# Patient Record
Sex: Female | Born: 1975 | Race: White | Hispanic: No | Smoking: Former smoker
Health system: Southern US, Community
[De-identification: ages and names within clinical notes are randomized; demographics above are authoritative.]

## PROBLEM LIST (undated history)

## (undated) DIAGNOSIS — N809 Endometriosis, unspecified: Secondary | ICD-10-CM

## (undated) DIAGNOSIS — F988 Other specified behavioral and emotional disorders with onset usually occurring in childhood and adolescence: Secondary | ICD-10-CM

## (undated) DIAGNOSIS — F419 Anxiety disorder, unspecified: Secondary | ICD-10-CM

## (undated) DIAGNOSIS — F319 Bipolar disorder, unspecified: Secondary | ICD-10-CM

## (undated) HISTORY — PX: LAPAROSCOPY: SHX197

## (undated) HISTORY — DX: Anxiety disorder, unspecified: F41.9

## (undated) HISTORY — DX: Bipolar disorder, unspecified: F31.9

## (undated) HISTORY — PX: EYE SURGERY: SHX253

## (undated) HISTORY — PX: ABDOMINAL HYSTERECTOMY: SHX81

## (undated) HISTORY — PX: TONSILLECTOMY: SUR1361

## (undated) HISTORY — DX: Endometriosis, unspecified: N80.9

## (undated) HISTORY — PX: WISDOM TOOTH EXTRACTION: SHX21

## (undated) HISTORY — PX: APPENDECTOMY: SHX54

## (undated) HISTORY — DX: Other specified behavioral and emotional disorders with onset usually occurring in childhood and adolescence: F98.8

---

## 2018-01-19 LAB — HM PAP SMEAR: HM Pap smear: NORMAL

## 2019-01-22 ENCOUNTER — Encounter: Payer: Self-pay | Admitting: Nurse Practitioner

## 2019-01-22 ENCOUNTER — Other Ambulatory Visit: Payer: Self-pay

## 2019-01-22 ENCOUNTER — Ambulatory Visit (INDEPENDENT_AMBULATORY_CARE_PROVIDER_SITE_OTHER): Payer: PRIVATE HEALTH INSURANCE | Admitting: Nurse Practitioner

## 2019-01-22 VITALS — Ht 63.0 in | Wt 137.0 lb

## 2019-01-22 DIAGNOSIS — L299 Pruritus, unspecified: Secondary | ICD-10-CM

## 2019-01-22 DIAGNOSIS — F988 Other specified behavioral and emotional disorders with onset usually occurring in childhood and adolescence: Secondary | ICD-10-CM | POA: Insufficient documentation

## 2019-01-22 DIAGNOSIS — F909 Attention-deficit hyperactivity disorder, unspecified type: Secondary | ICD-10-CM | POA: Insufficient documentation

## 2019-01-22 DIAGNOSIS — R5383 Other fatigue: Secondary | ICD-10-CM

## 2019-01-22 DIAGNOSIS — H538 Other visual disturbances: Secondary | ICD-10-CM | POA: Diagnosis not present

## 2019-01-22 DIAGNOSIS — L209 Atopic dermatitis, unspecified: Secondary | ICD-10-CM | POA: Insufficient documentation

## 2019-01-22 NOTE — Assessment & Plan Note (Signed)
Presenting with polydipsia and vision changes.  Will have her come in office next week for face to face evaluation and labs, including A1C testing.  Referral for eye exam placed.

## 2019-01-22 NOTE — Assessment & Plan Note (Signed)
Referral for eye exam 

## 2019-01-22 NOTE — Assessment & Plan Note (Signed)
To bilateral feet.  Will have her come in office next week for face to face evaluation and labs, including A1C.  Determine treatment plan upon evaluation and labs.

## 2019-01-22 NOTE — Patient Instructions (Signed)
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
## 2019-01-22 NOTE — Assessment & Plan Note (Signed)
No current medications, previously had been on Adderall.  Continue to monitor.  If symptoms increase will send to psychiatry for further testing and recommendations.  She is aware that if Adderall recommended will need controlled substance contract and annual drug screen, which she agrees to.

## 2019-01-25 ENCOUNTER — Telehealth: Payer: Self-pay | Admitting: Nurse Practitioner

## 2019-01-25 ENCOUNTER — Ambulatory Visit: Payer: PRIVATE HEALTH INSURANCE | Admitting: Nurse Practitioner

## 2019-01-25 ENCOUNTER — Encounter: Payer: Self-pay | Admitting: Nurse Practitioner

## 2019-01-25 ENCOUNTER — Other Ambulatory Visit: Payer: Self-pay

## 2019-01-25 VITALS — BP 121/85 | HR 75 | Temp 99.1°F

## 2019-01-25 DIAGNOSIS — L2089 Other atopic dermatitis: Secondary | ICD-10-CM | POA: Diagnosis not present

## 2019-01-25 DIAGNOSIS — Z13220 Encounter for screening for lipoid disorders: Secondary | ICD-10-CM

## 2019-01-25 DIAGNOSIS — H538 Other visual disturbances: Secondary | ICD-10-CM

## 2019-01-25 DIAGNOSIS — R5383 Other fatigue: Secondary | ICD-10-CM | POA: Diagnosis not present

## 2019-01-25 LAB — BAYER DCA HB A1C WAIVED: HB A1C (BAYER DCA - WAIVED): 5.3 % (ref <7.0)

## 2019-01-25 MED ORDER — CLOBETASOL PROPIONATE 0.05 % EX CREA: 1.0000 "application " | TOPICAL_CREAM | Freq: Two times a day (BID) | CUTANEOUS | 1 refills | Status: DC

## 2019-01-25 NOTE — Telephone Encounter (Signed)
Message relayed to patient. Verbalized understanding and denied questions.   

## 2019-01-25 NOTE — Assessment & Plan Note (Signed)
A1C 5.3%.  Labs ordered include thyroid, CBD, CMP, Vit D, lipid panel.  Recommend continued use of Melatonin at night as needed.  Consider Trazodone at night for sleep if continued poor sleep pattern.

## 2019-01-25 NOTE — Telephone Encounter (Signed)
Copied from [place] [id number]. Topic: Referral - Status >> Jan 22, 2019  4:34 PM Yvette Rack wrote: Reason for CRM: Natale Milch with [facility] stated they received a referral request for this pt but it says for snoring and she wanted to know if the request was meant to be sent to ENT. Cb# [phone]

## 2019-01-25 NOTE — Assessment & Plan Note (Signed)
Labs today.  A1C 5.3%, discussed with patient.  Is scheduled for eye exam in a couple weeks.

## 2019-01-25 NOTE — Patient Instructions (Signed)

## 2019-01-25 NOTE — Assessment & Plan Note (Signed)
To bilateral feet.  Trial Clobetasol cream and have return in 4 weeks for assessment.

## 2019-01-25 NOTE — Progress Notes (Signed)
BP 121/85   Pulse 75   Temp 99.1 F (37.3 C) (Oral)   SpO2 99%    Subjective:    Patient ID: Lisa Baird, female    DOB: [birth year], 43 y.o.   MRN: [id number]  HPI: Lisa Baird is a 43 y.o. female  Chief Complaint  Patient presents with  . Follow-up   FATIGUE Has noticed it more over past two weeks. Duration:  months Severity: moderate  Onset: gradual Context when symptoms started:  unknown Symptoms improve with rest: no  Depressive symptoms: no Stress/anxiety: no Insomnia: yes hard to stay asleep, wakes up 2-3 times a night, up at 6 am every day.  Does not take any medication to help her sleep.  Has tried Benadryl and Melatonin in past but both kept her awake.   Snoring: no Observed apnea by bed partner: no Daytime hypersomnolence:no Wakes feeling refreshed: no History of sleep study: no Dysnea on exertion:  no Orthopnea/PND: no Chest pain: no Chronic cough: no Lower extremity edema: no Arthralgias:no Myalgias: no Weakness: no Rash: no   DRY SKIN FEET: Noted to R>L with pruritus.  Only to bottom of feet.  She reports no pain to areas.  States it has been present for a month or two and the pruritus at times wakes her up at night.  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  Gets pedicures.  Has tried all OTC creams including Lamisil, Cortisone, abx ointment, and herbal remedies.  Relevant past medical, surgical, family and social history reviewed and updated as indicated. Interim medical history since our last visit reviewed. Allergies and medications reviewed and updated.  Review of Systems  Constitutional: Positive for fatigue. Negative for activity change, appetite change, diaphoresis and fever.  Respiratory: Negative for cough, chest tightness and shortness of breath.   Cardiovascular: Negative for chest pain, palpitations and leg swelling.  Gastrointestinal: Negative for abdominal distention, abdominal pain, constipation, diarrhea, nausea and  vomiting.  Endocrine: Positive for polyphagia. Negative for cold intolerance, heat intolerance, polydipsia and polyuria.  Neurological: Negative for dizziness, syncope, weakness, light-headedness, numbness and headaches.  Psychiatric/Behavioral: Negative.     Per HPI unless specifically indicated above     Objective:    BP 121/85   Pulse 75   Temp 99.1 F (37.3 C) (Oral)   SpO2 99%   Wt Readings from Last 3 Encounters:  01/22/19 137 lb (62.1 kg)    Physical Exam Vitals signs and nursing note reviewed.  Constitutional:      General: She is awake. She is not in acute distress.    Appearance: She is well-developed. She is not ill-appearing.  HENT:     Head: Normocephalic.     Right Ear: Hearing normal.     Left Ear: Hearing normal.     Nose: Nose normal.     Mouth/Throat:     Mouth: Mucous membranes are moist.  Eyes:     General: Lids are normal.        Right eye: No discharge.        Left eye: No discharge.     Conjunctiva/sclera: Conjunctivae normal.     Pupils: Pupils are equal, round, and reactive to light.  Neck:     Musculoskeletal: Normal range of motion and neck supple.     Thyroid: No thyromegaly.     Vascular: No carotid bruit.  Cardiovascular:     Rate and Rhythm: Normal rate and regular rhythm.     Pulses:  Dorsalis pedis pulses are 2+ on the right side and 2+ on the left side.       Posterior tibial pulses are 2+ on the right side and 2+ on the left side.     Heart sounds: Normal heart sounds. No murmur. No gallop.   Pulmonary:     Effort: Pulmonary effort is normal. No accessory muscle usage or respiratory distress.     Breath sounds: Normal breath sounds.  Abdominal:     General: Bowel sounds are normal.     Palpations: Abdomen is soft. There is no hepatomegaly or splenomegaly.     Tenderness: There is no abdominal tenderness.  Musculoskeletal:     Right lower leg: No edema.     Left lower leg: No edema.  Lymphadenopathy:     Cervical: No  cervical adenopathy.  Skin:    General: Skin is warm and dry.     Comments: Pedal aspect of bilateral feet at arch, R>L, with 5 cm dry patch of skin with white scaling, no erythema or drainage.  No scaling or erythema noted to toes or in between toes.    Neurological:     Mental Status: She is alert and oriented to person, place, and time.     Cranial Nerves: Cranial nerves are intact.     Deep Tendon Reflexes: Reflexes are normal and symmetric.     Reflex Scores:      Brachioradialis reflexes are 2+ on the right side and 2+ on the left side.      Patellar reflexes are 2+ on the right side and 2+ on the left side. Psychiatric:        Attention and Perception: Attention normal.        Mood and Affect: Mood normal.        Speech: Speech normal.        Behavior: Behavior normal. Behavior is cooperative.        Thought Content: Thought content normal.        Judgment: Judgment normal.     No results found for this or any previous visit.    Assessment & Plan:   Problem List Items Addressed This Visit      Musculoskeletal and Integument   Atopic dermatitis    To bilateral feet.  Trial Clobetasol cream and have return in 4 weeks for assessment.        Other   Fatigue - Primary    A1C 5.3%.  Labs ordered include thyroid, CBD, CMP, Vit D, lipid panel.  Recommend continued use of Melatonin at night as needed.  Consider Trazodone at night for sleep if continued poor sleep pattern.      Relevant Orders   Bayer DCA Hb A1c Waived   Comprehensive metabolic panel   CBC with Differential/Platelet   Thyroid Panel With TSH   VITAMIN D 25 Hydroxy (Vit-D Deficiency, Fractures)   Blurred vision, bilateral    Labs today.  A1C 5.3%, discussed with patient.  Is scheduled for eye exam in a couple weeks.      Relevant Orders   Bayer DCA Hb A1c Waived   Comprehensive metabolic panel   CBC with Differential/Platelet   Thyroid Panel With TSH    Other Visit Diagnoses    Screening cholesterol  level       Relevant Orders   Lipid Panel w/o Chol/HDL Ratio       Follow up plan: Return in about 4 weeks (around 02/22/2019) for Fatigue and atopic  dermatitis.

## 2019-01-25 NOTE — Addendum Note (Signed)
Addended by: Marnee Guarneri T on: 01/25/2019 08:45 AM   Modules accepted: Orders

## 2019-01-26 ENCOUNTER — Encounter: Payer: Self-pay | Admitting: Nurse Practitioner

## 2019-01-26 DIAGNOSIS — E559 Vitamin D deficiency, unspecified: Secondary | ICD-10-CM | POA: Insufficient documentation

## 2019-01-26 LAB — CBC WITH DIFFERENTIAL/PLATELET
Basophils Absolute: 0.1 10*3/uL (ref 0.0–0.2)
Basos: 1 %
EOS (ABSOLUTE): 0.1 10*3/uL (ref 0.0–0.4)
Eos: 1 %
Hematocrit: 39.2 % (ref 34.0–46.6)
Hemoglobin: 12.8 g/dL (ref 11.1–15.9)
Immature Grans (Abs): 0 10*3/uL (ref 0.0–0.1)
Immature Granulocytes: 0 %
Lymphocytes Absolute: 3.4 10*3/uL — ABNORMAL HIGH (ref 0.7–3.1)
Lymphs: 36 %
MCH: 28.8 pg (ref 26.6–33.0)
MCHC: 32.7 g/dL (ref 31.5–35.7)
MCV: 88 fL (ref 79–97)
Monocytes Absolute: 0.6 10*3/uL (ref 0.1–0.9)
Monocytes: 6 %
Neutrophils Absolute: 5.5 10*3/uL (ref 1.4–7.0)
Neutrophils: 56 %
Platelets: 178 10*3/uL (ref 150–450)
RBC: 4.44 x10E6/uL (ref 3.77–5.28)
RDW: 12.4 % (ref 11.7–15.4)
WBC: 9.7 10*3/uL (ref 3.4–10.8)

## 2019-01-26 LAB — COMPREHENSIVE METABOLIC PANEL
ALT: 15 IU/L (ref 0–32)
AST: 15 IU/L (ref 0–40)
Albumin/Globulin Ratio: 2.1 (ref 1.2–2.2)
Albumin: 4.7 g/dL (ref 3.8–4.8)
Alkaline Phosphatase: 44 IU/L (ref 39–117)
BUN/Creatinine Ratio: 17 (ref 9–23)
BUN: 14 mg/dL (ref 6–24)
Bilirubin Total: 0.4 mg/dL (ref 0.0–1.2)
CO2: 23 mmol/L (ref 20–29)
Calcium: 9.4 mg/dL (ref 8.7–10.2)
Chloride: 103 mmol/L (ref 96–106)
Creatinine, Ser: 0.83 mg/dL (ref 0.57–1.00)
GFR calc Af Amer: 101 mL/min/{1.73_m2} (ref >59)
GFR calc non Af Amer: 88 mL/min/{1.73_m2} (ref >59)
Globulin, Total: 2.2 g/dL (ref 1.5–4.5)
Glucose: 84 mg/dL (ref 65–99)
Potassium: 4.2 mmol/L (ref 3.5–5.2)
Sodium: 143 mmol/L (ref 134–144)
Total Protein: 6.9 g/dL (ref 6.0–8.5)

## 2019-01-26 LAB — THYROID PANEL WITH TSH
Free Thyroxine Index: 1.8 (ref 1.2–4.9)
T3 Uptake Ratio: 25 % (ref 24–39)
T4, Total: 7 ug/dL (ref 4.5–12.0)
TSH: 2.39 u[IU]/mL (ref 0.450–4.500)

## 2019-01-26 LAB — VITAMIN D 25 HYDROXY (VIT D DEFICIENCY, FRACTURES): Vit D, 25-Hydroxy: 28 ng/mL — ABNORMAL LOW (ref 30.0–100.0)

## 2019-01-26 LAB — LIPID PANEL W/O CHOL/HDL RATIO
Cholesterol, Total: 172 mg/dL (ref 100–199)
HDL: 81 mg/dL (ref >39)
LDL Calculated: 77 mg/dL (ref 0–99)
Triglycerides: 71 mg/dL (ref 0–149)
VLDL Cholesterol Cal: 14 mg/dL (ref 5–40)

## 2019-02-09 ENCOUNTER — Other Ambulatory Visit: Payer: Self-pay | Admitting: Nurse Practitioner

## 2019-02-09 MED ORDER — KETOCONAZOLE 2 % EX CREA: 1.0000 "application " | TOPICAL_CREAM | Freq: Every day | CUTANEOUS | 0 refills | Status: DC

## 2019-02-23 ENCOUNTER — Ambulatory Visit: Payer: PRIVATE HEALTH INSURANCE | Admitting: Nurse Practitioner

## 2019-04-15 DIAGNOSIS — U071 COVID-19: Secondary | ICD-10-CM

## 2019-04-15 HISTORY — DX: COVID-19: U07.1

## 2019-04-19 ENCOUNTER — Other Ambulatory Visit: Payer: Self-pay | Admitting: Nurse Practitioner

## 2019-04-19 MED ORDER — AZITHROMYCIN 250 MG PO TABS: ORAL_TABLET | ORAL | 0 refills | Status: DC

## 2019-04-19 MED ORDER — BENZONATATE 100 MG PO CAPS: 100.0000 mg | ORAL_CAPSULE | Freq: Three times a day (TID) | ORAL | 0 refills | Status: DC | PRN

## 2019-04-19 NOTE — Progress Notes (Signed)
Patient with positive diagnosis of Covid 19 recently, continues to have symptoms with no relief from OTC medications.  Will send in scripts and advise if worsening to go to ER immediately.

## 2020-01-21 ENCOUNTER — Encounter: Payer: Self-pay | Admitting: Nurse Practitioner

## 2020-01-21 ENCOUNTER — Ambulatory Visit: Payer: PRIVATE HEALTH INSURANCE | Admitting: Nurse Practitioner

## 2020-01-21 ENCOUNTER — Other Ambulatory Visit: Payer: Self-pay

## 2020-01-21 VITALS — BP 112/78 | HR 86 | Temp 98.4°F | Wt 141.4 lb

## 2020-01-21 DIAGNOSIS — F909 Attention-deficit hyperactivity disorder, unspecified type: Secondary | ICD-10-CM

## 2020-01-21 NOTE — Progress Notes (Signed)
BP 112/78   Pulse 86   Temp 98.4 F (36.9 C) (Oral)   Wt 141 lb 6.4 oz (64.1 kg)   LMP 01/13/2020 (Approximate)   SpO2 96%   BMI 25.05 kg/m    Subjective:    Patient ID: Lisa Baird, female    DOB: 1976/02/16, 44 y.o.   MRN: [id number]  HPI: Lisa Baird is a 44 y.o. female  Chief Complaint  Patient presents with  . ADHD    pt states she would like to discuss starting something for ADHD, states it is getting worse lately   ADHD  Has history of ADHD  -- feels like symptoms are becoming worse lately.  Took a generic form of Adderall in past.  Has been off medication for almost 2 years.  Was seen by psychiatry in past. ADHD status: uncontrolled Previous medications: Adderall Work/school performance:  fair Difficulty sustaining attention/completing tasks: yes Distracted by extraneous stimuli: yes Does not listen when spoken to: yes  Fidgets with hands or feet: yes Unable to stay in seat: yes Blurts out/interrupts others: no    Anxiousness: when overwhelmed    Dizziness: no    Tics: no  Relevant past medical, surgical, family and social history reviewed and updated as indicated. Interim medical history since our last visit reviewed. Allergies and medications reviewed and updated.  Review of Systems  Constitutional: Negative for activity change, appetite change, diaphoresis, fatigue and fever.  Respiratory: Negative for cough, chest tightness and shortness of breath.   Cardiovascular: Negative for chest pain, palpitations and leg swelling.  Gastrointestinal: Negative.   Neurological: Negative.   Psychiatric/Behavioral: Positive for decreased concentration. Negative for self-injury, sleep disturbance and suicidal ideas. The patient is nervous/anxious.     Per HPI unless specifically indicated above     Objective:    BP 112/78   Pulse 86   Temp 98.4 F (36.9 C) (Oral)   Wt 141 lb 6.4 oz (64.1 kg)   LMP 01/13/2020 (Approximate)   SpO2 96%   BMI 25.05 kg/m   Wt  Readings from Last 3 Encounters:  01/21/20 141 lb 6.4 oz (64.1 kg)  01/22/19 137 lb (62.1 kg)    Physical Exam Vitals and nursing note reviewed.  Constitutional:      General: She is awake. She is not in acute distress.    Appearance: She is well-developed and well-groomed. She is not ill-appearing.  HENT:     Head: Normocephalic.     Right Ear: Hearing normal.     Left Ear: Hearing normal.  Eyes:     General: Lids are normal.        Right eye: No discharge.        Left eye: No discharge.     Conjunctiva/sclera: Conjunctivae normal.     Pupils: Pupils are equal, round, and reactive to light.  Neck:     Thyroid: No thyromegaly.     Vascular: No carotid bruit.  Cardiovascular:     Rate and Rhythm: Normal rate and regular rhythm.     Heart sounds: Normal heart sounds. No murmur heard.  No gallop.   Pulmonary:     Effort: Pulmonary effort is normal. No accessory muscle usage or respiratory distress.     Breath sounds: Normal breath sounds.  Abdominal:     General: Bowel sounds are normal.     Palpations: Abdomen is soft.  Musculoskeletal:     Cervical back: Normal range of motion and neck supple.  Right lower leg: No edema.     Left lower leg: No edema.  Skin:    General: Skin is warm and dry.  Neurological:     Mental Status: She is alert and oriented to person, place, and time.  Psychiatric:        Attention and Perception: Attention normal.        Mood and Affect: Mood normal.        Speech: Speech normal.        Behavior: Behavior normal. Behavior is cooperative.        Thought Content: Thought content normal.     Results for orders placed or performed in visit on 01/21/20  HM PAP SMEAR  Result Value Ref Range   HM Pap smear patient reported normal       Assessment & Plan:   Problem List Items Addressed This Visit      Other   ADHD - Primary    No current medications, previously had been on Adderall.  Feels symptoms are returning.  Will refer to  [facility] for testing and medication initiation.  Discussed with her and she would like testing, once on stable regimen then PCP can takeover regimen in office and prescribing.  She is aware that if medication initiated will need controlled substance contract and annual drug screen, which she agrees to.      Relevant Orders   Ambulatory referral to Psychiatry       Follow up plan: Return if symptoms worsen or fail to improve.

## 2020-01-21 NOTE — Patient Instructions (Signed)

## 2020-01-21 NOTE — Assessment & Plan Note (Signed)
No current medications, previously had been on Adderall.  Feels symptoms are returning.  Will refer to [facility] for testing and medication initiation.  Discussed with her and she would like testing, once on stable regimen then PCP can takeover regimen in office and prescribing.  She is aware that if medication initiated will need controlled substance contract and annual drug screen, which she agrees to.

## 2020-01-26 NOTE — Addendum Note (Signed)
Addended by: Marnee Guarneri T on: 01/26/2020 03:37 PM   Modules accepted: Orders

## 2020-05-11 ENCOUNTER — Ambulatory Visit: Payer: PRIVATE HEALTH INSURANCE | Admitting: Family Medicine

## 2020-05-15 ENCOUNTER — Ambulatory Visit: Payer: PRIVATE HEALTH INSURANCE | Admitting: Family Medicine

## 2020-05-16 ENCOUNTER — Ambulatory Visit: Payer: PRIVATE HEALTH INSURANCE | Admitting: Nurse Practitioner

## 2020-05-16 ENCOUNTER — Other Ambulatory Visit: Payer: Self-pay

## 2020-05-16 ENCOUNTER — Encounter: Payer: Self-pay | Admitting: Nurse Practitioner

## 2020-05-16 VITALS — BP 113/72 | HR 79 | Temp 98.3°F | Ht 63.0 in | Wt 134.2 lb

## 2020-05-16 DIAGNOSIS — K625 Hemorrhage of anus and rectum: Secondary | ICD-10-CM

## 2020-05-16 NOTE — Progress Notes (Signed)
BP 113/72    Pulse 79    Temp 98.3 F (36.8 C) (Oral)    Ht 5\' 3"  (1.6 m)    Wt 134 lb 3.2 oz (60.9 kg)    LMP 04/19/2020 (Approximate)    SpO2 96%    BMI 23.77 kg/m    Subjective:    Patient ID: Lisa Baird, female    DOB: [birth year], 44 y.o.   MRN: [id number]  HPI: Lisa Baird is a 44 y.o. female  Chief Complaint  Patient presents with   Rectal Bleeding    On and off for 2 weeks, Bright red blood , no pain today   RECTAL BLEEDING Started about 2 weeks ago, on and off -- has noticed more than 10 times.  Had one episode of blood drops on floor, bright red blood.  No pain with this.  A few times has happened when passed a bowel movement and then other moments have been when she did pass bowel movement.  Does not perform anal intercourse.  Denies constipation, has regular/soft bowel movements.  Does have one child -- 26 years old -- vaginal birth.  Has had hemorrhoids before, but had discomfort with this.  No family history of colon cancer.  Non smoker.  Casual drinker.  Had episode yesterday, but not today. Duration: weeks Anal fullness: no Perianal itching/irritation: no Perianal pain: no Bright red rectal bleeding: yes Amount of blood: x 2 times without BM it was with small and then with BM it is moderate Frequency: 10 episodes over past two weeks Constipation: no Hard stools: no Chronic straining/valsava: no Alleviating factors: nothing Aggravating factors: nothing Status: fluctuating Treatments attempted: nothing  Previous hemorrhoids: yes  Colonoscopy: no  Relevant past medical, surgical, family and social history reviewed and updated as indicated. Interim medical history since our last visit reviewed. Allergies and medications reviewed and updated.  Review of Systems  Constitutional: Negative for activity change, appetite change, diaphoresis, fatigue and fever.  Respiratory: Negative for cough, chest tightness and shortness of breath.   Cardiovascular: Negative for  chest pain, palpitations and leg swelling.  Gastrointestinal: Positive for anal bleeding. Negative for abdominal distention, abdominal pain, blood in stool, constipation, diarrhea, nausea, rectal pain and vomiting.  Endocrine: Negative for cold intolerance and heat intolerance.  Psychiatric/Behavioral: Negative.     Per HPI unless specifically indicated above     Objective:    BP 113/72    Pulse 79    Temp 98.3 F (36.8 C) (Oral)    Ht 5\' 3"  (1.6 m)    Wt 134 lb 3.2 oz (60.9 kg)    LMP 04/19/2020 (Approximate)    SpO2 96%    BMI 23.77 kg/m   Wt Readings from Last 3 Encounters:  05/16/20 134 lb 3.2 oz (60.9 kg)  01/21/20 141 lb 6.4 oz (64.1 kg)  01/22/19 137 lb (62.1 kg)    Physical Exam Vitals and nursing note reviewed.  Constitutional:      General: She is awake. She is not in acute distress.    Appearance: She is well-developed and well-groomed. She is not ill-appearing.  HENT:     Head: Normocephalic.     Right Ear: Hearing normal.     Left Ear: Hearing normal.  Eyes:     General: Lids are normal.        Right eye: No discharge.        Left eye: No discharge.     Conjunctiva/sclera: Conjunctivae normal.  Pupils: Pupils are equal, round, and reactive to light.  Neck:     Thyroid: No thyromegaly.     Vascular: No carotid bruit.  Cardiovascular:     Rate and Rhythm: Normal rate and regular rhythm.     Heart sounds: Normal heart sounds. No murmur heard.  No gallop.   Pulmonary:     Effort: Pulmonary effort is normal. No accessory muscle usage or respiratory distress.     Breath sounds: Normal breath sounds.  Abdominal:     General: Bowel sounds are normal. There is no distension.     Palpations: Abdomen is soft. There is no hepatomegaly or splenomegaly.     Tenderness: There is no abdominal tenderness. There is no right CVA tenderness or left CVA tenderness.     Hernia: No hernia is present.  Genitourinary:    Rectum: Internal hemorrhoid (small hemorrhoid noted)  present. No mass, tenderness or external hemorrhoid. Normal anal tone.     Comments: External rectal exam no hemorrhoids noted, small internal hemorrhoid palpated.  No blood on glove with exam. Musculoskeletal:     Cervical back: Normal range of motion and neck supple.     Right lower leg: No edema.     Left lower leg: No edema.  Skin:    General: Skin is warm and dry.  Neurological:     Mental Status: She is alert and oriented to person, place, and time.  Psychiatric:        Attention and Perception: Attention normal.        Mood and Affect: Mood normal.        Speech: Speech normal.        Behavior: Behavior normal. Behavior is cooperative.        Thought Content: Thought content normal.     Results for orders placed or performed in visit on 01/21/20  HM PAP SMEAR  Result Value Ref Range   HM Pap smear patient reported normal       Assessment & Plan:   Problem List Items Addressed This Visit      Digestive   Rectal bleeding - Primary    Present x 2 weeks.  Painless and present with and without BM.  ?internal hemorrhoid.  Will obtain labs today to include CBC, iron, FOBT, CMP, TSH.  Recommend she document events and any symptoms + whether BM present or not -- track bleeding episodes.  Will place referral to GI for further work-up.  Discussed at length with her.  To follow-up in 4 weeks, sooner if any worsening symptoms -- discussed with her at length.      Relevant Orders   CBC with Differential/Platelet   TSH   Fecal occult blood, imunochemical   Iron, TIBC and Ferritin Panel   Comprehensive metabolic panel   Ambulatory referral to Gastroenterology       Follow up plan: Return in about 4 weeks (around 06/13/2020) for Rectal bleeding.

## 2020-05-16 NOTE — Patient Instructions (Signed)
Rectal Bleeding  Rectal bleeding is when blood comes out of the opening of the butt (anus). People with this kind of bleeding may notice bright red blood in their underwear or in the toilet after they poop (have a bowel movement). They may also have dark red or black poop (stool). Rectal bleeding is often a sign that something is wrong. It needs to be checked by a doctor. Follow these instructions at home: Watch for any changes in your condition. Take these actions to help with bleeding and discomfort:  Eat a diet that is high in fiber. This will keep your poop soft so it is easier for you to poop without pushing too hard. Ask your doctor to tell you what foods and drinks are high in fiber.  Drink enough fluid to keep your pee (urine) clear or pale yellow. This also helps keep your poop soft.  Try taking a warm bath. This may help with pain.  Keep all follow-up visits as told by your doctor. This is important. Get help right away if:  You have new bleeding.  You have more bleeding than before.  You have black or dark red poop.  You throw up (vomit) blood or something that looks like coffee grounds.  You have pain or tenderness in your belly (abdomen).  You have a fever.  You feel weak.  You feel sick to your stomach (nauseous).  You pass out (faint).  You have very bad pain in your butt.  You cannot poop. This information is not intended to replace advice given to you by your health care provider. Make sure you discuss any questions you have with your health care provider. Document Revised: 06/13/2017 Document Reviewed: 08/27/2015 Elsevier Patient Education  2020 [employer].

## 2020-05-16 NOTE — Assessment & Plan Note (Signed)
Present x 2 weeks.  Painless and present with and without BM.  ?internal hemorrhoid.  Will obtain labs today to include CBC, iron, FOBT, CMP, TSH.  Recommend she document events and any symptoms + whether BM present or not -- track bleeding episodes.  Will place referral to GI for further work-up.  Discussed at length with her.  To follow-up in 4 weeks, sooner if any worsening symptoms -- discussed with her at length.

## 2020-05-17 LAB — CBC WITH DIFFERENTIAL/PLATELET
Basophils Absolute: 0.1 10*3/uL (ref 0.0–0.2)
Basos: 1 %
EOS (ABSOLUTE): 0.1 10*3/uL (ref 0.0–0.4)
Eos: 1 %
Hematocrit: 37.9 % (ref 34.0–46.6)
Hemoglobin: 12.6 g/dL (ref 11.1–15.9)
Immature Grans (Abs): 0 10*3/uL (ref 0.0–0.1)
Immature Granulocytes: 0 %
Lymphocytes Absolute: 2.7 10*3/uL (ref 0.7–3.1)
Lymphs: 34 %
MCH: 29.9 pg (ref 26.6–33.0)
MCHC: 33.2 g/dL (ref 31.5–35.7)
MCV: 90 fL (ref 79–97)
Monocytes Absolute: 0.5 10*3/uL (ref 0.1–0.9)
Monocytes: 6 %
Neutrophils Absolute: 4.7 10*3/uL (ref 1.4–7.0)
Neutrophils: 58 %
Platelets: 223 10*3/uL (ref 150–450)
RBC: 4.22 x10E6/uL (ref 3.77–5.28)
RDW: 12.5 % (ref 11.7–15.4)
WBC: 8.1 10*3/uL (ref 3.4–10.8)

## 2020-05-17 LAB — COMPREHENSIVE METABOLIC PANEL
ALT: 14 IU/L (ref 0–32)
AST: 13 IU/L (ref 0–40)
Albumin/Globulin Ratio: 2.1 (ref 1.2–2.2)
Albumin: 4.5 g/dL (ref 3.8–4.8)
Alkaline Phosphatase: 53 IU/L (ref 44–121)
BUN/Creatinine Ratio: 23 (ref 9–23)
BUN: 17 mg/dL (ref 6–24)
Bilirubin Total: 0.3 mg/dL (ref 0.0–1.2)
CO2: 24 mmol/L (ref 20–29)
Calcium: 9.1 mg/dL (ref 8.7–10.2)
Chloride: 105 mmol/L (ref 96–106)
Creatinine, Ser: 0.74 mg/dL (ref 0.57–1.00)
GFR calc Af Amer: 116 mL/min/{1.73_m2} (ref >59)
GFR calc non Af Amer: 100 mL/min/{1.73_m2} (ref >59)
Globulin, Total: 2.1 g/dL (ref 1.5–4.5)
Glucose: 82 mg/dL (ref 65–99)
Potassium: 4.4 mmol/L (ref 3.5–5.2)
Sodium: 141 mmol/L (ref 134–144)
Total Protein: 6.6 g/dL (ref 6.0–8.5)

## 2020-05-17 LAB — IRON,TIBC AND FERRITIN PANEL
Ferritin: 60 ng/mL (ref 15–150)
Iron Saturation: 29 % (ref 15–55)
Iron: 86 ug/dL (ref 27–159)
Total Iron Binding Capacity: 301 ug/dL (ref 250–450)
UIBC: 215 ug/dL (ref 131–425)

## 2020-05-17 LAB — TSH: TSH: 1.16 u[IU]/mL (ref 0.450–4.500)

## 2020-05-17 NOTE — Progress Notes (Signed)
Contacted via [place] morning Chele.  Hope your day is wonderful.  Your labs have returned and the good news is they are overall in normal range.  No anemia, which is good considering you have been having bleeding.  Makes me suspect more that this may be internal hemorrhoids.  We will see what the GI providers say.  Let me know if any questions? Keep being awesome!!  Thank you for allowing me to participate in your care. Kindest regards, Shanessa Hodak

## 2020-06-12 ENCOUNTER — Encounter: Payer: Self-pay | Admitting: Nurse Practitioner

## 2020-06-12 ENCOUNTER — Telehealth (INDEPENDENT_AMBULATORY_CARE_PROVIDER_SITE_OTHER): Payer: PRIVATE HEALTH INSURANCE | Admitting: Nurse Practitioner

## 2020-06-12 ENCOUNTER — Telehealth: Payer: Self-pay

## 2020-06-12 DIAGNOSIS — K625 Hemorrhage of anus and rectum: Secondary | ICD-10-CM | POA: Diagnosis not present

## 2020-06-12 NOTE — Telephone Encounter (Signed)
Copied from [place] [id number]. Topic: Appointment Scheduling - Scheduling Inquiry for Clinic >> Jun 12, 2020 12:22 PM Greggory Keen D wrote: Reason for CRM: pt called saying her husband just got sent home from work because another employee has covid .  She wants to know can she still come in today or should hr appt be virtual.  Her husband has no symptoms.  CB#  [phone]

## 2020-06-12 NOTE — Telephone Encounter (Signed)
I would recommend we change to virtual at this time until her husband is tested.  Thank you.

## 2020-06-12 NOTE — Telephone Encounter (Signed)
PT call she ok with virtual visit

## 2020-06-12 NOTE — Telephone Encounter (Signed)
Called pt no answer left vm 

## 2020-06-12 NOTE — Progress Notes (Signed)
There were no vitals taken for this visit.   Subjective:    Patient ID: Lisa Baird, female    DOB: [birth year], 44 y.o.   MRN: [id number]  HPI: Lisa Baird is a 44 y.o. female  Chief Complaint  Patient presents with  . Rectal Bleeding    follow-up for rectal bleeding, no more bleeding for about 2 weeks     . This visit was completed via MyChart due to the restrictions of the COVID-19 pandemic. All issues as above were discussed and addressed. Physical exam was done as above through visual confirmation on MyChart. If it was felt that the patient should be evaluated in the office, they were directed there. The patient verbally consented to this visit. . Location of the patient: work . Location of the provider: work . Those involved with this call:  . Provider: Marnee Guarneri, DNP . CMA: Yvonna Alanis, CMA . Front Desk/Registration: Don Perking  . Time spent on call: 20 minutes with patient face to face via video conference. More than 50% of this time was spent in counseling and coordination of care. 25 minutes total spent in review of patient's record and preparation of their chart.  . I verified patient identity using two factors (patient name and date of birth). Patient consents verbally to being seen via telemedicine visit today.    RECTAL BLEEDING  Follow-up for rectal bleeding, seen initially 05/16/20 for this.  Labs at time unremarkable and has not returned FOBT as of yet.  She is scheduled to see GI upcoming, 07/10/20.  Has had no further bleeding episodes, had a total of 6 more incidents after recent visit and then no further.  Last episode 2 weeks and 3 days ago.  Last visit notes: Started about 2 weeks ago, on and off -- has noticed more than 10 times.  Had one episode of blood drops on floor, bright red blood.  No pain with this.  A few times has happened when passed a bowel movement and then other moments have been when she did not pass bowel movement.  Does not  perform anal intercourse.  Denies constipation, has regular/soft bowel movements.  Does have one child -- 24 years old -- vaginal birth.  Has had hemorrhoids before, but had discomfort with this.  No family history of colon cancer.  Non smoker.  Casual drinker.  Had episode yesterday, but not today. Duration: weeks Anal fullness: no Perianal itching/irritation: no Perianal pain: no Constipation: no Hard stools: no Chronic straining/valsava: no Alleviating factors: nothing Aggravating factors: nothing Status: improved Treatments attempted: nothing  Previous hemorrhoids: yes  Colonoscopy: no  Relevant past medical, surgical, family and social history reviewed and updated as indicated. Interim medical history since our last visit reviewed. Allergies and medications reviewed and updated.  Review of Systems  Constitutional: Negative for activity change, appetite change, diaphoresis, fatigue and fever.  Respiratory: Negative for cough, chest tightness and shortness of breath.   Cardiovascular: Negative for chest pain, palpitations and leg swelling.  Gastrointestinal: Negative for abdominal distention, abdominal pain, anal bleeding, blood in stool, constipation, diarrhea, nausea, rectal pain and vomiting.  Endocrine: Negative for cold intolerance and heat intolerance.  Psychiatric/Behavioral: Negative.     Per HPI unless specifically indicated above     Objective:    There were no vitals taken for this visit.  Wt Readings from Last 3 Encounters:  05/16/20 134 lb 3.2 oz (60.9 kg)  01/21/20 141 lb 6.4 oz (64.1 kg)  01/22/19 137  lb (62.1 kg)    Physical Exam Vitals and nursing note reviewed.  Constitutional:      General: She is awake. She is not in acute distress.    Appearance: She is well-developed. She is not ill-appearing.  HENT:     Head: Normocephalic.     Right Ear: Hearing normal.     Left Ear: Hearing normal.  Eyes:     General: Lids are normal.        Right eye: No  discharge.        Left eye: No discharge.     Conjunctiva/sclera: Conjunctivae normal.  Pulmonary:     Effort: Pulmonary effort is normal. No accessory muscle usage or respiratory distress.  Musculoskeletal:     Cervical back: Normal range of motion.  Neurological:     Mental Status: She is alert and oriented to person, place, and time.  Psychiatric:        Attention and Perception: Attention normal.        Mood and Affect: Mood normal.        Behavior: Behavior normal. Behavior is cooperative.        Thought Content: Thought content normal.        Judgment: Judgment normal.     Results for orders placed or performed in visit on 05/16/20  CBC with Differential/Platelet  Result Value Ref Range   WBC 8.1 3.4 - 10.8 x10E3/uL   RBC 4.22 3.77 - 5.28 x10E6/uL   Hemoglobin 12.6 11.1 - 15.9 g/dL   Hematocrit 37.9 34.0 - 46.6 %   MCV 90 79 - 97 fL   MCH 29.9 26.6 - 33.0 pg   MCHC 33.2 31 - 35 g/dL   RDW 12.5 11.7 - 15.4 %   Platelets 223 150 - 450 x10E3/uL   Neutrophils 58 Not Estab. %   Lymphs 34 Not Estab. %   Monocytes 6 Not Estab. %   Eos 1 Not Estab. %   Basos 1 Not Estab. %   Neutrophils Absolute 4.7 1.40 - 7.00 x10E3/uL   Lymphocytes Absolute 2.7 0 - 3 x10E3/uL   Monocytes Absolute 0.5 0 - 0 x10E3/uL   EOS (ABSOLUTE) 0.1 0.0 - 0.4 x10E3/uL   Basophils Absolute 0.1 0 - 0 x10E3/uL   Immature Granulocytes 0 Not Estab. %   Immature Grans (Abs) 0.0 0.0 - 0.1 x10E3/uL  TSH  Result Value Ref Range   TSH 1.160 0.450 - 4.500 uIU/mL  Iron, TIBC and Ferritin Panel  Result Value Ref Range   Total Iron Binding Capacity 301 250 - 450 ug/dL   UIBC 215 131 - 425 ug/dL   Iron 86 27 - 159 ug/dL   Iron Saturation 29 15 - 55 %   Ferritin 60 15.0 - 150.0 ng/mL  Comprehensive metabolic panel  Result Value Ref Range   Glucose 82 65 - 99 mg/dL   BUN 17 6 - 24 mg/dL   Creatinine, Ser 0.74 0.57 - 1.00 mg/dL   GFR calc non Af Amer 100 >59 mL/min/1.73   GFR calc Af Amer 116 >59  mL/min/1.73   BUN/Creatinine Ratio 23 9 - 23   Sodium 141 134 - 144 mmol/L   Potassium 4.4 3.5 - 5.2 mmol/L   Chloride 105 96 - 106 mmol/L   CO2 24 20 - 29 mmol/L   Calcium 9.1 8.7 - 10.2 mg/dL   Total Protein 6.6 6.0 - 8.5 g/dL   Albumin 4.5 3.8 - 4.8 g/dL   Globulin, Total  2.1 1.5 - 4.5 g/dL   Albumin/Globulin Ratio 2.1 1.2 - 2.2   Bilirubin Total 0.3 0.0 - 1.2 mg/dL   Alkaline Phosphatase 53 44 - 121 IU/L   AST 13 0 - 40 IU/L   ALT 14 0 - 32 IU/L      Assessment & Plan:   Problem List Items Addressed This Visit      Digestive   Rectal bleeding    Acute and improved with no further episodes in over 2 weeks.  Painless and present with and without BM.  ?internal hemorrhoid.  Recent labs unremarkable.  Recommend she document events and any symptoms + whether BM present or not -- track bleeding episodes.  Will maintain GI visit for further recommendations due to length of bleeding and symptoms, suspect more internal hemorrhoids.  Return if ongoing or worsening.         I discussed the assessment and treatment plan with the patient. The patient was provided an opportunity to ask questions and all were answered. The patient agreed with the plan and demonstrated an understanding of the instructions.   The patient was advised to call back or seek an in-person evaluation if the symptoms worsen or if the condition fails to improve as anticipated.   I provided 21+ minutes of time during this encounter.  Follow up plan: Return if symptoms worsen or fail to improve.

## 2020-06-12 NOTE — Assessment & Plan Note (Signed)
Acute and improved with no further episodes in over 2 weeks.  Painless and present with and without BM.  ?internal hemorrhoid.  Recent labs unremarkable.  Recommend she document events and any symptoms + whether BM present or not -- track bleeding episodes.  Will maintain GI visit for further recommendations due to length of bleeding and symptoms, suspect more internal hemorrhoids.  Return if ongoing or worsening.

## 2020-06-12 NOTE — Patient Instructions (Signed)
Rectal Bleeding  Rectal bleeding is when blood comes out of the opening of the butt (anus). People with this kind of bleeding may notice bright red blood in their underwear or in the toilet after they poop (have a bowel movement). They may also have dark red or black poop (stool). Rectal bleeding is often a sign that something is wrong. It needs to be checked by a doctor. Follow these instructions at home: Watch for any changes in your condition. Take these actions to help with bleeding and discomfort:  Eat a diet that is high in fiber. This will keep your poop soft so it is easier for you to poop without pushing too hard. Ask your doctor to tell you what foods and drinks are high in fiber.  Drink enough fluid to keep your pee (urine) clear or pale yellow. This also helps keep your poop soft.  Try taking a warm bath. This may help with pain.  Keep all follow-up visits as told by your doctor. This is important. Get help right away if:  You have new bleeding.  You have more bleeding than before.  You have black or dark red poop.  You throw up (vomit) blood or something that looks like coffee grounds.  You have pain or tenderness in your belly (abdomen).  You have a fever.  You feel weak.  You feel sick to your stomach (nauseous).  You pass out (faint).  You have very bad pain in your butt.  You cannot poop. This information is not intended to replace advice given to you by your health care provider. Make sure you discuss any questions you have with your health care provider. Document Revised: 06/13/2017 Document Reviewed: 08/27/2015 Elsevier Patient Education  2020 [employer].

## 2020-07-10 ENCOUNTER — Ambulatory Visit: Payer: PRIVATE HEALTH INSURANCE | Admitting: Gastroenterology

## 2020-07-12 ENCOUNTER — Ambulatory Visit (INDEPENDENT_AMBULATORY_CARE_PROVIDER_SITE_OTHER): Payer: PRIVATE HEALTH INSURANCE | Admitting: Gastroenterology

## 2020-07-12 ENCOUNTER — Other Ambulatory Visit: Payer: Self-pay

## 2020-07-12 ENCOUNTER — Encounter: Payer: Self-pay | Admitting: Gastroenterology

## 2020-07-12 VITALS — BP 126/90 | HR 96 | Ht 63.0 in | Wt 138.4 lb

## 2020-07-12 DIAGNOSIS — K625 Hemorrhage of anus and rectum: Secondary | ICD-10-CM | POA: Diagnosis not present

## 2020-07-12 MED ORDER — PEG 3350-KCL-NA BICARB-NACL 420 G PO SOLR: 4000.0000 mL | Freq: Once | ORAL | 0 refills | Status: AC

## 2020-07-13 ENCOUNTER — Telehealth: Payer: Self-pay

## 2020-07-13 ENCOUNTER — Other Ambulatory Visit: Payer: Self-pay

## 2020-07-13 NOTE — Progress Notes (Signed)
Lisa Baird [address]  [address]  [place], [place] [address]  Main: [phone]  Fax: [phone]   Gastroenterology Consultation  Referring Provider:     Marjie Skiff, NP Primary Care Physician:  Marjie Skiff, NP Reason for Consultation:     BRBPR        HPI:    Chief Complaint  Patient presents with  . New Patient (Initial Visit)    Rectal bleeding    Lisa Baird is a 44 y.o. y/o female referred for consultation & management  by Dr. Marjie Skiff, NP.  Patient reports bright red blood per rectum.  And started about 6 months to a year ago, very intermittent.  Occurs with or without straining.  No prior colonoscopy.  No family history of colon cancer.  No weight loss.  No nausea or vomiting.  No dysphagia.  Reports 1 soft bowel movement daily.  Past Medical History:  Diagnosis Date  . ADD (attention deficit disorder)   . Anxiety   . Bipolar disorder (HCC)   . Endometriosis     Past Surgical History:  Procedure Laterality Date  . APPENDECTOMY    . EYE SURGERY    . LAPAROSCOPY     Endometriosis  . TONSILLECTOMY    . WISDOM TOOTH EXTRACTION      Prior to Admission medications   Medication Sig Start Date End Date Taking? Authorizing Provider  amphetamine-dextroamphetamine (ADDERALL) 20 MG tablet TAKE 1 ORAL TABLET ONCE A DAY POST BREAKFAST 03/06/20  Yes [provider]  clobetasol cream (TEMOVATE) 0.05 % Apply 1 application topically 2 (two) times daily. 01/25/19  Yes Cannady, Jolene T, NP  EUCRISA 2 % OINT Apply 1 application topically 2 (two) times daily. 03/30/20  Yes [provider]  ketoconazole (NIZORAL) 2 % cream Apply 1 application topically daily. 02/09/19  Yes Marjie Skiff, NP    Family History  Problem Relation Age of Onset  . Emphysema Mother   . Prostate cancer Father   . Hyperlipidemia Father   . Hypertension Father   . Diabetes Paternal Uncle   . Brain cancer Paternal Uncle   . Hypertension  Maternal Grandmother   . Heart disease Maternal Grandmother      Social History   Tobacco Use  . Smoking status: Former Smoker    Types: Cigarettes  . Smokeless tobacco: Never Used  Vaping Use  . Vaping Use: Former  Substance Use Topics  . Alcohol use: Yes    Comment: Socially  . Drug use: Never    Comment: Marijuana as a teenager    Allergies as of 07/12/2020 - Review Complete 07/12/2020  Allergen Reaction Noted  . Tegretol [carbamazepine] Swelling and Rash 01/22/2019    Review of Systems:    All systems reviewed and negative except where noted in HPI.   Physical Exam:  BP 126/90 (BP Location: Left Arm, Patient Position: Sitting, Cuff Size: Normal)   Pulse 96   Ht 5\' 3"  (1.6 m)   Wt 138 lb 6.4 oz (62.8 kg)   BMI 24.52 kg/m  No LMP recorded. Psych:  Alert and cooperative. Normal mood and affect. General:   Alert,  Well-developed, well-nourished, pleasant and cooperative in NAD Head:  Normocephalic and atraumatic. Eyes:  Sclera clear, no icterus.   Conjunctiva pink. Ears:  Normal auditory acuity. Nose:  No deformity, discharge, or lesions. Mouth:  No deformity or lesions,oropharynx pink & moist. Neck:  Supple; no masses or thyromegaly. Abdomen:  Normal bowel sounds.  No bruits.  Soft, non-tender and non-distended without masses, hepatosplenomegaly or hernias noted.  No guarding or rebound tenderness.   . Rectal exam: No hemorrhoids present, small nontender anal fissure seen, no masses in rectal vault.  Brown stool in rectal vault Msk:  Symmetrical without gross deformities. Good, equal movement & strength bilaterally. Pulses:  Normal pulses noted. Extremities:  No clubbing or edema.  No cyanosis. Neurologic:  Alert and oriented x3;  grossly normal neurologically. Skin:  Intact without significant lesions or rashes. No jaundice. Lymph Nodes:  No significant cervical adenopathy. Psych:  Alert and cooperative. Normal mood and affect.   Labs: CBC    Component Value  Date/Time   WBC 8.1 05/16/2020 1617   RBC 4.22 05/16/2020 1617   HGB 12.6 05/16/2020 1617   HCT 37.9 05/16/2020 1617   PLT 223 05/16/2020 1617   MCV 90 05/16/2020 1617   MCH 29.9 05/16/2020 1617   MCHC 33.2 05/16/2020 1617   RDW 12.5 05/16/2020 1617   LYMPHSABS 2.7 05/16/2020 1617   EOSABS 0.1 05/16/2020 1617   BASOSABS 0.1 05/16/2020 1617   CMP     Component Value Date/Time   NA 141 05/16/2020 1617   K 4.4 05/16/2020 1617   CL 105 05/16/2020 1617   CO2 24 05/16/2020 1617   GLUCOSE 82 05/16/2020 1617   BUN 17 05/16/2020 1617   CREATININE 0.74 05/16/2020 1617   CALCIUM 9.1 05/16/2020 1617   PROT 6.6 05/16/2020 1617   ALBUMIN 4.5 05/16/2020 1617   AST 13 05/16/2020 1617   ALT 14 05/16/2020 1617   ALKPHOS 53 05/16/2020 1617   BILITOT 0.3 05/16/2020 1617   GFRNONAA 100 05/16/2020 1617   GFRAA 116 05/16/2020 1617    Imaging Studies: No results found.  Assessment and Plan:   Lisa Baird is a 44 y.o. y/o female has been referred for bright red blood per rectum  Given the absence of hemorrhoids and ongoing bright red blood per rectum despite a high-fiber diet and soft formed bowel movements daily, we discussed that the etiology of her prior blood per rectum could be her small anal fissures seen, however other etiologies such as internal hemorrhoids or malignancy cannot be ruled out without a colonoscopy  Alternative options of conservative management were discussed in detail, including but not limited to medication management, foregoing endoscopic procedures at this time and others.    After above discussion, patient would like to proceed with colonoscopy for further evaluation of her symptoms  I have discussed alternative options, risks & benefits,  which include, but are not limited to, bleeding, infection, perforation,respiratory complication & drug reaction.  The patient agrees with this plan & written consent will be obtained.    High-fiber diet encouraged  Anal  fissure seen is not causing any pain at this time. Will forego topical treatment at this time.   Evaluate further with colonoscopy and treat as necessary for any internal hemorrhoids present  Dr Vonda Antigua  Speech recognition software was used to dictate the above note.

## 2020-07-13 NOTE — Telephone Encounter (Signed)
Patient called back stating she could go to [place] instead on that same day.

## 2020-07-13 NOTE — Progress Notes (Signed)
Updated instructions have been sent.

## 2020-07-13 NOTE — Telephone Encounter (Signed)
Called patient to inform her Dr. Karie Schwalbe will actually be in [place] on the day of her scheduled procedure and we need to reschedule it. LVM to call office back.

## 2020-07-17 ENCOUNTER — Encounter: Payer: Self-pay | Admitting: Gastroenterology

## 2020-07-17 ENCOUNTER — Other Ambulatory Visit: Payer: Self-pay

## 2020-07-21 ENCOUNTER — Other Ambulatory Visit: Payer: Self-pay

## 2020-07-21 ENCOUNTER — Other Ambulatory Visit
Admission: RE | Admit: 2020-07-21 | Discharge: 2020-07-21 | Disposition: A | Discharge status: Home / Self Care | Payer: Managed Care, Other (non HMO) | Source: Ambulatory Visit | Attending: Gastroenterology | Admitting: Gastroenterology

## 2020-07-21 DIAGNOSIS — Z01812 Encounter for preprocedural laboratory examination: Secondary | ICD-10-CM | POA: Insufficient documentation

## 2020-07-21 DIAGNOSIS — Z20822 Contact with and (suspected) exposure to covid-19: Secondary | ICD-10-CM | POA: Insufficient documentation

## 2020-07-21 LAB — SARS CORONAVIRUS 2 (TAT 6-24 HRS): SARS Coronavirus 2: NEGATIVE

## 2020-07-24 ENCOUNTER — Other Ambulatory Visit: Payer: Self-pay | Admitting: Gastroenterology

## 2020-07-24 ENCOUNTER — Encounter: Payer: Self-pay | Admitting: Gastroenterology

## 2020-07-24 DIAGNOSIS — K625 Hemorrhage of anus and rectum: Secondary | ICD-10-CM

## 2020-07-24 MED ORDER — ONDANSETRON HCL 4 MG PO TABS: 4.0000 mg | ORAL_TABLET | Freq: Three times a day (TID) | ORAL | 1 refills | Status: DC | PRN

## 2020-07-24 MED ORDER — POLYETHYLENE GLYCOL 3350 17 GM/SCOOP PO POWD: 1.0000 | Freq: Once | ORAL | 0 refills | Status: AC

## 2020-07-24 NOTE — Discharge Instructions (Signed)

## 2020-07-25 ENCOUNTER — Ambulatory Visit
Admission: RE | Admit: 2020-07-25 | Discharge: 2020-07-25 | Disposition: A | Discharge status: Home / Self Care | Payer: Managed Care, Other (non HMO) | Attending: Gastroenterology | Admitting: Gastroenterology

## 2020-07-25 ENCOUNTER — Encounter
Admission: RE | Disposition: A | Discharge status: Home / Self Care | Payer: Self-pay | Source: Home / Self Care | Attending: Gastroenterology

## 2020-07-25 ENCOUNTER — Encounter: Payer: Self-pay | Admitting: Gastroenterology

## 2020-07-25 ENCOUNTER — Ambulatory Visit: Payer: Managed Care, Other (non HMO) | Admitting: Anesthesiology

## 2020-07-25 ENCOUNTER — Other Ambulatory Visit: Payer: Self-pay

## 2020-07-25 DIAGNOSIS — K602 Anal fissure, unspecified: Secondary | ICD-10-CM | POA: Insufficient documentation

## 2020-07-25 DIAGNOSIS — Z79899 Other long term (current) drug therapy: Secondary | ICD-10-CM | POA: Diagnosis not present

## 2020-07-25 DIAGNOSIS — Z888 Allergy status to other drugs, medicaments and biological substances status: Secondary | ICD-10-CM | POA: Insufficient documentation

## 2020-07-25 DIAGNOSIS — K625 Hemorrhage of anus and rectum: Secondary | ICD-10-CM

## 2020-07-25 DIAGNOSIS — K5520 Angiodysplasia of colon without hemorrhage: Secondary | ICD-10-CM

## 2020-07-25 DIAGNOSIS — K5730 Diverticulosis of large intestine without perforation or abscess without bleeding: Secondary | ICD-10-CM | POA: Insufficient documentation

## 2020-07-25 DIAGNOSIS — K921 Melena: Secondary | ICD-10-CM | POA: Diagnosis present

## 2020-07-25 DIAGNOSIS — Z87891 Personal history of nicotine dependence: Secondary | ICD-10-CM | POA: Insufficient documentation

## 2020-07-25 HISTORY — PX: COLONOSCOPY WITH PROPOFOL: SHX5780

## 2020-07-25 SURGERY — COLONOSCOPY WITH PROPOFOL
Anesthesia: General | Site: Rectum

## 2020-07-25 MED ORDER — LACTATED RINGERS IV SOLN: INTRAVENOUS | Status: DC | PRN

## 2020-07-25 MED ORDER — LIDOCAINE HCL (CARDIAC) PF 100 MG/5ML IV SOSY
PREFILLED_SYRINGE | INTRAVENOUS | Status: DC | PRN
  Administered 2020-07-25: 50 mg via INTRAVENOUS

## 2020-07-25 MED ORDER — PROPOFOL 10 MG/ML IV BOLUS
INTRAVENOUS | Status: DC | PRN
  Administered 2020-07-25: 150 mg via INTRAVENOUS
  Administered 2020-07-25 (×8): 40 mg via INTRAVENOUS

## 2020-07-25 MED ORDER — SODIUM CHLORIDE 0.9 % IV SOLN: INTRAVENOUS | Status: DC

## 2020-07-25 MED ORDER — STERILE WATER FOR IRRIGATION IR SOLN
Status: DC | PRN
  Administered 2020-07-25: .05 mL

## 2020-07-25 MED ORDER — LIDOCAINE HCL (CARDIAC) PF 100 MG/5ML IV SOSY: PREFILLED_SYRINGE | INTRAVENOUS | Status: DC | PRN

## 2020-07-25 MED ORDER — LACTATED RINGERS IV SOLN: INTRAVENOUS | Status: DC

## 2020-07-25 SURGICAL SUPPLY — 25 items
CLIP HMST 235XBRD CATH ROT (MISCELLANEOUS) IMPLANT
CLIP RESOLUTION 360 11X235 (MISCELLANEOUS)
ELECT REM PT RETURN 9FT ADLT (ELECTROSURGICAL)
ELECTRODE REM PT RTRN 9FT ADLT (ELECTROSURGICAL) IMPLANT
FCP ESCP3.2XJMB 240X2.8X (MISCELLANEOUS)
FORCEPS BIOP RAD 4 LRG CAP 4 (CUTTING FORCEPS) IMPLANT
FORCEPS BIOP RJ4 240 W/NDL (MISCELLANEOUS)
FORCEPS ESCP3.2XJMB 240X2.8X (MISCELLANEOUS) IMPLANT
GOWN CVR UNV OPN BCK APRN NK (MISCELLANEOUS) ×2 IMPLANT
GOWN ISOL THUMB LOOP REG UNIV (MISCELLANEOUS) ×4
INJECTOR VARIJECT VIN23 (MISCELLANEOUS) IMPLANT
KIT DEFENDO VALVE AND CONN (KITS) IMPLANT
KIT PRC NS LF DISP ENDO (KITS) ×1 IMPLANT
KIT PROCEDURE OLYMPUS (KITS) ×2
MANIFOLD NEPTUNE II (INSTRUMENTS) ×2 IMPLANT
MARKER SPOT ENDO TATTOO 5ML (MISCELLANEOUS) IMPLANT
PROBE APC STR FIRE (PROBE) ×2 IMPLANT
RETRIEVER NET ROTH 2.5X230 LF (MISCELLANEOUS) IMPLANT
SNARE SHORT THROW 13M SML OVAL (MISCELLANEOUS) IMPLANT
SNARE SHORT THROW 30M LRG OVAL (MISCELLANEOUS) IMPLANT
SNARE SNG USE RND 15MM (INSTRUMENTS) IMPLANT
SPOT EX ENDOSCOPIC TATTOO (MISCELLANEOUS)
TRAP ETRAP POLY (MISCELLANEOUS) IMPLANT
VARIJECT INJECTOR VIN23 (MISCELLANEOUS)
WATER STERILE IRR 250ML POUR (IV SOLUTION) ×2 IMPLANT

## 2020-07-25 NOTE — Op Note (Signed)
[facility] Gastroenterology Patient Name: Lisa Baird Procedure Date: 07/25/2020 9:56 AM MRN: [id number] Account #: [id number] Date of Birth: 10-05-1975 Admit Type: Outpatient Age: 45 Room: [facility] OR ROOM 01 Gender: Female Note Status: Finalized Procedure:             Colonoscopy Indications:           Hematochezia Providers:             Ingra Rother B. Bonna Gains MD, MD Referring MD:          Venita Lick NP Medicines:             Monitored Anesthesia Care Complications:         No immediate complications. Procedure:             Pre-Anesthesia Assessment:                        - Prior to the procedure, a History and Physical was                         performed, and patient medications, allergies and                         sensitivities were reviewed. The patient's tolerance                         of previous anesthesia was reviewed.                        - The risks and benefits of the procedure and the                         sedation options and risks were discussed with the                         patient. All questions were answered and informed                         consent was obtained.                        - Patient identification and proposed procedure were                         verified prior to the procedure by the physician, the                         nurse, the anesthetist and the technician. The                         procedure was verified in the pre-procedure area in                         the procedure room in the endoscopy suite.                        - ASA Grade Assessment: II - A patient with mild                         systemic disease.                        -  After reviewing the risks and benefits, the patient                         was deemed in satisfactory condition to undergo the                         procedure.                        After obtaining informed consent, the colonoscope was                         passed  under direct vision. Throughout the procedure,                         the patient's blood pressure, pulse, and oxygen                         saturations were monitored continuously. The was                         introduced through the anus and advanced to the the                         cecum, identified by appendiceal orifice and ileocecal                         valve. The colonoscopy was performed with ease. The                         patient tolerated the procedure well. The quality of                         the bowel preparation was good. Findings:      The perianal exam findings include anal fissure.      A single diverticulum was found in the cecum.      Multiple diverticula were found in the sigmoid colon.      A single angioectasia without bleeding was found in the ascending colon.       Coagulation for bleeding prevention using argon plasma was successful.      The exam was otherwise without abnormality.      The rectum, sigmoid colon, descending colon, transverse colon, ascending       colon and cecum appeared normal.      Anal papilla(e) were hypertrophied.      No additional abnormalities were found on retroflexion. Impression:            - Anal fissure found on perianal exam.                        - Diverticulosis in the cecum.                        - Diverticulosis in the sigmoid colon.                        - A single non-bleeding colonic angioectasia. Treated  with argon plasma coagulation (APC).                        - The examination was otherwise normal.                        - The rectum, sigmoid colon, descending colon,                         transverse colon, ascending colon and cecum are normal.                        - Anal papilla(e) were hypertrophied.                        - No specimens collected. Recommendation:        - Topical treatment for anal fissue to be sent to                         specialty pharmacy by my  clinic. Please call my clinic                         at [phone] to obtain the prescription in the next                         1-2 days                        - Discharge patient to home.                        - Resume previous diet.                        - Continue present medications.                        - Return to primary care physician as previously                         scheduled.                        - The findings and recommendations were discussed with                         the patient.                        - The findings and recommendations were discussed with                         the patient's family.                        - High fiber diet. Procedure Code(s):     --- Professional ---                        [phone], Colonoscopy, flexible; with control of  bleeding, any method Diagnosis Code(s):     --- Professional ---                        K92.1, Melena (includes Hematochezia)                        K55.20, Angiodysplasia of colon without hemorrhage CPT copyright 2019 American Medical Association. All rights reserved. The codes documented in this report are preliminary and upon coder review may  be revised to meet current compliance requirements.  Melodie Bouillon, MD Michel Bickers B. Maximino Greenland MD, MD 07/25/2020 10:45:44 AM This report has been signed electronically. Number of Addenda: 0 Note Initiated On: 07/25/2020 9:56 AM Scope Withdrawal Time: 0 hours 20 minutes 2 seconds  Total Procedure Duration: 0 hours 27 minutes 35 seconds  Estimated Blood Loss:  Estimated blood loss: none.      [facility]

## 2020-07-25 NOTE — H&P (Signed)
Vonda Antigua, MD [address] [place], [place], [place], [address] [address], [place], [place], [place], [address] Phone: [phone]  Fax: [phone]  Primary Care Physician:  Venita Lick, NP   Pre-Procedure History & Physical: HPI:  Lisa Baird is a 45 y.o. female is here for a colonoscopy.   Past Medical History:  Diagnosis Date  . ADD (attention deficit disorder)   . Anxiety   . Bipolar disorder ([place])   . Endometriosis     Past Surgical History:  Procedure Laterality Date  . APPENDECTOMY    . EYE SURGERY    . LAPAROSCOPY     Endometriosis  . TONSILLECTOMY    . WISDOM TOOTH EXTRACTION      Prior to Admission medications   Medication Sig Start Date End Date Taking? Authorizing Provider  amphetamine-dextroamphetamine (ADDERALL) 20 MG tablet TAKE 1 ORAL TABLET ONCE A DAY POST BREAKFAST 03/06/20  Yes [provider]  clobetasol cream (TEMOVATE) 2.99 % Apply 1 application topically 2 (two) times daily. 01/25/19  Yes Cannady, Jolene T, NP  EUCRISA 2 % OINT Apply 1 application topically 2 (two) times daily. 03/30/20  Yes [provider]  ketoconazole (NIZORAL) 2 % cream Apply 1 application topically daily. 02/09/19  Yes Cannady, Jolene T, NP  progesterone (PROMETRIUM) 200 MG capsule Take 200 mg by mouth daily.   Yes [provider]  ondansetron (ZOFRAN) 4 MG tablet Take 1 tablet (4 mg total) by mouth every 8 (eight) hours as needed for up to 7 doses for nausea or vomiting. 07/24/20   Lin Landsman, MD    Allergies as of 07/12/2020 - Review Complete 07/12/2020  Allergen Reaction Noted  . Tegretol [carbamazepine] Swelling and Rash 01/22/2019    Family History  Problem Relation Age of Onset  . Emphysema Mother   . Prostate cancer Father   . Hyperlipidemia Father   . Hypertension Father   . Diabetes Paternal Uncle   . Brain cancer Paternal Uncle   . Hypertension Maternal Grandmother   . Heart disease Maternal Grandmother      Social History   Socioeconomic History  . Marital status: Married    Spouse name: Not on file  . Number of children: Not on file  . Years of education: Not on file  . Highest education level: Not on file  Occupational History  . Occupation: Psychiatric nurse: [employer]  Tobacco Use  . Smoking status: Former Smoker    Types: Cigarettes    Quit date: 07/2017    Years since quitting: 3.0  . Smokeless tobacco: Never Used  Vaping Use  . Vaping Use: Former  Substance and Sexual Activity  . Alcohol use: Yes    Comment: Socially  . Drug use: Never    Comment: Marijuana as a teenager  . Sexual activity: Yes    Partners: Male    Birth control/protection: None  Other Topics Concern  . Not on file  Social History Narrative  . Not on file   Social Determinants of Health   Financial Resource Strain: Not on file  Food Insecurity: Not on file  Transportation Needs: Not on file  Physical Activity: Not on file  Stress: Not on file  Social Connections: Not on file  Intimate Partner Violence: Not on file    Review of Systems: See HPI, otherwise negative ROS  Physical Exam: BP 110/81   Pulse 87   Temp 97.6 F (36.4 C) (Temporal)   Ht 5'  3" (1.6 m)   Wt 59.9 kg   SpO2 98%   BMI 23.38 kg/m  General:   Alert,  pleasant and cooperative in NAD Head:  Normocephalic and atraumatic. Neck:  Supple; no masses or thyromegaly. Lungs:  Clear throughout to auscultation, normal respiratory effort.    Heart:  +S1, +S2, Regular rate and rhythm, No edema. Abdomen:  Soft, nontender and nondistended. Normal bowel sounds, without guarding, and without rebound.   Neurologic:  Alert and  oriented x4;  grossly normal neurologically.  Impression/Plan: Lisa Baird is here for a colonoscopy to be performed for BRBPR  Risks, benefits, limitations, and alternatives regarding  colonoscopy have been reviewed with the patient.  Questions have been answered.  All parties agreeable.   Virgel Manifold, MD  07/25/2020, 9:56 AM

## 2020-07-25 NOTE — Transfer of Care (Signed)
Immediate Anesthesia Transfer of Care Note  Patient: Lisa Baird  Procedure(s) Performed: COLONOSCOPY WITH PROPOFOL (N/A Rectum)  Patient Location: PACU  Anesthesia Type: General  Level of Consciousness: awake, alert  and patient cooperative  Airway and Oxygen Therapy: Patient Spontanous Breathing and Patient connected to supplemental oxygen  Post-op Assessment: Post-op Vital signs reviewed, Patient's Cardiovascular Status Stable, Respiratory Function Stable, Patent Airway and No signs of Nausea or vomiting  Post-op Vital Signs: Reviewed and stable  Complications: No complications documented.

## 2020-07-25 NOTE — Anesthesia Postprocedure Evaluation (Signed)
Anesthesia Post Note  Patient: Lisa Baird  Procedure(s) Performed: COLONOSCOPY WITH PROPOFOL (N/A Rectum)     Patient location during evaluation: PACU Anesthesia Type: General Level of consciousness: awake and alert Pain management: pain level controlled Vital Signs Assessment: post-procedure vital signs reviewed and stable Respiratory status: spontaneous breathing and nonlabored ventilation Cardiovascular status: blood pressure returned to baseline Postop Assessment: no apparent nausea or vomiting Anesthetic complications: no   No complications documented.  Darrion Wyszynski [employer]

## 2020-07-25 NOTE — Anesthesia Procedure Notes (Signed)
Date/Time: 07/25/2020 10:05 AM Performed by: Cameron Ali, CRNA Pre-anesthesia Checklist: Patient identified, Emergency Drugs available, Suction available, Timeout performed and Patient being monitored Patient Re-evaluated:Patient Re-evaluated prior to induction Oxygen Delivery Method: Nasal cannula Placement Confirmation: positive ETCO2

## 2020-07-25 NOTE — Anesthesia Preprocedure Evaluation (Signed)
Anesthesia Evaluation  Patient identified by MRN, date of birth, ID band Patient awake    Reviewed: Allergy & Precautions, NPO status , Patient's Chart, lab work & pertinent test results  Airway Mallampati: II  TM Distance: >3 FB Neck ROM: Full    Dental no notable dental hx.    Pulmonary former smoker,    Pulmonary exam normal        Cardiovascular Exercise Tolerance: Good negative cardio ROS Normal cardiovascular exam     Neuro/Psych PSYCHIATRIC DISORDERS Anxiety Bipolar Disorder    GI/Hepatic Neg liver ROS, bright red blood per rectum    Endo/Other  negative endocrine ROS  Renal/GU negative Renal ROS     Musculoskeletal negative musculoskeletal ROS (+)   Abdominal Normal abdominal exam  (+)   Peds  Hematology negative hematology ROS (+)   Anesthesia Other Findings   Reproductive/Obstetrics                             Anesthesia Physical Anesthesia Plan  ASA: II  Anesthesia Plan: General   Post-op Pain Management:    Induction:   PONV Risk Score and Plan: 3 and Propofol infusion, TIVA and Treatment may vary due to age or medical condition  Airway Management Planned: Natural Airway and Nasal Cannula  Additional Equipment: None  Intra-op Plan:   Post-operative Plan:   Informed Consent: I have reviewed the patients History and Physical, chart, labs and discussed the procedure including the risks, benefits and alternatives for the proposed anesthesia with the patient or authorized representative who has indicated his/her understanding and acceptance.     Dental advisory given  Plan Discussed with: CRNA  Anesthesia Plan Comments:         Anesthesia Quick Evaluation

## 2020-07-26 ENCOUNTER — Encounter: Payer: Self-pay | Admitting: Gastroenterology

## 2020-07-26 ENCOUNTER — Telehealth: Payer: Self-pay

## 2020-07-26 NOTE — Telephone Encounter (Signed)
-----   Message from Virgel Manifold, MD sent at 07/25/2020 10:50 AM EST ----- Please send nifedipine 0.2% ointment BID for 4 weeks to specialty pharmacy for anal fissure

## 2020-07-26 NOTE — Telephone Encounter (Signed)
Called [facility] to let them know that patient needed the below prescription filled. They stated that they would be calling the patient to let them know that her prescription will probably be ready until Friday since they are short staffed.

## 2020-09-18 DIAGNOSIS — Z8742 Personal history of other diseases of the female genital tract: Secondary | ICD-10-CM | POA: Diagnosis not present

## 2020-09-18 DIAGNOSIS — N941 Unspecified dyspareunia: Secondary | ICD-10-CM | POA: Diagnosis not present

## 2020-09-18 DIAGNOSIS — R102 Pelvic and perineal pain: Secondary | ICD-10-CM | POA: Diagnosis not present

## 2020-09-25 DIAGNOSIS — R102 Pelvic and perineal pain: Secondary | ICD-10-CM | POA: Diagnosis not present

## 2020-09-26 DIAGNOSIS — R102 Pelvic and perineal pain: Secondary | ICD-10-CM | POA: Diagnosis not present

## 2020-09-26 DIAGNOSIS — N941 Unspecified dyspareunia: Secondary | ICD-10-CM | POA: Diagnosis not present

## 2020-09-26 DIAGNOSIS — Z8742 Personal history of other diseases of the female genital tract: Secondary | ICD-10-CM | POA: Diagnosis not present

## 2020-10-03 ENCOUNTER — Other Ambulatory Visit: Payer: Self-pay | Admitting: Obstetrics and Gynecology

## 2020-10-13 DIAGNOSIS — Z79899 Other long term (current) drug therapy: Secondary | ICD-10-CM | POA: Diagnosis not present

## 2020-10-30 NOTE — H&P (Signed)
Lisa Baird is a 45 y.o. female here for LAVH, bilateral salpingectomy.pt returns t pelvic pain with recent exacerbation . Pain sometimes on right and may rotate to the left . Known history of endometriosis diagnosed at age 31 .  Pt c/o of dyspareunia for sometime now .  U/s: Uterusanteverted  Endometrium=7.84mm  Left ovary appears wnl Rt simple ovarian cyst=1.9cm No free fluid seen  Fibroids seen:1)Rt anterior=2.1cm 2)Rt lateral=1.6cm 3)posterior to endometrium=1.9cm    Past Medical History:  has a past medical history of Endometriosis and Uterine fibroid.  Past Surgical History:  has a past surgical history that includes Appendectomy; laparoscopy diagnostic; and Dilation and curettage of uterus. Family History: family history includes Breast cancer (age of onset: 71) in her maternal aunt; High blood pressure (Hypertension) in her father. Social History:  reports that she has never smoked. She has never used smokeless tobacco. She reports current alcohol use. She reports that she does not use drugs. OB/GYN History:          OB History    Gravida  2   Para  1   Term  1   Preterm      AB  1   Living  1     SAB  1   IAB      Ectopic      Molar      Multiple      Live Births  1          Allergies: is allergic to carbamazepine. Medications:  Current Outpatient Medications:  .  dextroamphetamine-amphetamine (ADDERALL) 20 mg tablet, TAKE 1 ORAL TABLET ONCE A DAY POST BREAKFAST, Disp: , Rfl:  .  HYDROcodone-acetaminophen (NORCO) 7.5-325 mg tablet, Take 1 tablet by mouth every 8 (eight) hours as needed for Pain, Disp: 10 tablet, Rfl: 0 .  ondansetron (ZOFRAN) 8 MG tablet, Take 1 tablet (8 mg total) by mouth every 8 (eight) hours as needed for Nausea, Disp: 15 tablet, Rfl: 0 .  clobetasoL (TEMOVATE) 0.05 % cream, Apply topically 2 (two) times daily (Patient not taking: Reported on 09/26/2020  ), Disp: 30 g, Rfl: 0 .  EUCRISA 2 % Oint, , Disp: ,  Rfl:  .  hydrOXYzine pamoate (VISTARIL) 25 MG capsule, Take 25 mg by mouth nightly (Patient not taking: Reported on 09/26/2020  ), Disp: , Rfl:  .  progesterone (PROMETRIUM) 200 MG capsule, 1 cap po q hs x 12 hs (Patient not taking: Reported on 09/26/2020  ), Disp: 12 capsule, Rfl: 1  Review of Systems: General:                      No fatigue or weight loss Eyes:                           No vision changes Ears:                            No hearing difficulty Respiratory:                No cough or shortness of breath Pulmonary:                  No asthma or shortness of breath Cardiovascular:           No chest pain, palpitations, dyspnea on exertion Gastrointestinal:          No abdominal bloating,  chronic diarrhea, constipations, masses, pain or hematochezia Genitourinary:             No hematuria, dysuria, abnormal vaginal discharge,+ pelvic pain, Menometrorrhagia Lymphatic:                   No swollen lymph nodes Musculoskeletal:         No muscle weakness Neurologic:                  No extremity weakness, syncope, seizure disorder Psychiatric:                  No history of depression, delusions or suicidal/homicidal ideation    Exam:      Vitals:   10/31/2020 1546  BP: 122/80    Body mass index is 23.95 kg/m.  WDWN white/  female in NAD   Lungs: CTA  CV : RRR without murmur    Neck:  no thyromegaly Abdomen: soft , no mass, normal active bowel sounds,  non-tender, no rebound tenderness Pelvic: tanner stage 5 ,  External genitalia: vulva /labia no lesions Urethra: no prolapse Vagina: normal physiologic d/c, adequate room for LAVH  Cervix: no lesions, no cervical motion tenderness   Uterus: normal size shape and contour, non-tender Adnexa: no mass,  Mild TTP right ( previous exam )  Rectovaginal:   Impression:   The primary encounter diagnosis was Pelvic pain in female. Diagnoses of Dyspareunia, female and Hx of endometriosis were also pertinent to this  visit.  Pain may be c/w endometriosis , pelvic adhesions  Plan:   Offered tx options of expectant management, Depo Lupron tx , [place] tx, surgical intervention ie . LAVH / bilateral salpingectomy , lysis of adhesions if found.  After consideration she wishes to proceed with the latter .   She is aware if pain is endometriosis related that leaving ovaries in place may not alleviate pain . She may require one of the other tx options after surgery to tx endometriosis.  Risks of the procedure discussed with the pt . All questions answered          Caroline Sauger, MD

## 2020-11-02 ENCOUNTER — Other Ambulatory Visit: Payer: Self-pay

## 2020-11-02 ENCOUNTER — Other Ambulatory Visit
Admission: RE | Admit: 2020-11-02 | Discharge: 2020-11-02 | Disposition: A | Discharge status: Home / Self Care | Payer: BLUE CROSS/BLUE SHIELD | Source: Ambulatory Visit | Attending: Obstetrics and Gynecology | Admitting: Obstetrics and Gynecology

## 2020-11-02 DIAGNOSIS — Z01812 Encounter for preprocedural laboratory examination: Secondary | ICD-10-CM | POA: Diagnosis not present

## 2020-11-02 NOTE — Patient Instructions (Addendum)
Your procedure is scheduled on: 11/10/20 - Friday Report to the Registration Desk on the 1st floor of the [place]. To find out your arrival time, please call [phone] between 1PM - 3PM on: 11/09/20 - Thursday Report to [facility] for Covid Test and Labs on 11/08/20 at 0830 am. You will receive a bag on this day as well.  REMEMBER: Instructions that are not followed completely may result in serious medical risk, up to and including death; or upon the discretion of your surgeon and anesthesiologist your surgery may need to be rescheduled.  Do not eat food after midnight the night before surgery.  No gum chewing, lozengers or hard candies.  You may however, drink CLEAR liquids up to 2 hours before you are scheduled to arrive for your surgery. Do not drink anything within 2 hours of your scheduled arrival time.  Clear liquids include: - water  - apple juice without pulp - gatorade (not RED, PURPLE, OR BLUE) - black coffee or tea (Do NOT add milk or creamers to the coffee or tea) Do NOT drink anything that is not on this list.  Type 1 and Type 2 diabetics should only drink water.  In addition, your doctor has ordered for you to drink the provided  Ensure Pre-Surgery Clear Carbohydrate Drink  Drinking this carbohydrate drink up to two hours before surgery helps to reduce insulin resistance and improve patient outcomes. Please complete drinking 2 hours prior to scheduled arrival time.  TAKE THESE MEDICATIONS THE MORNING OF SURGERY WITH A SIP OF WATER:  - amphetamine-dextroamphetamine (ADDERALL) 20 MG tablet  One week prior to surgery: Stop Anti-inflammatories (NSAIDS) such as Advil, Aleve, Ibuprofen, Motrin, Naproxen, Naprosyn and Aspirin based products such as Excedrin, Goodys Powder, BC Powder.  Stop ANY OVER THE COUNTER supplements until after surgery.  No Alcohol for 24 hours before or after surgery.  No Smoking including e-cigarettes for 24 hours prior to surgery.  No  chewable tobacco products for at least 6 hours prior to surgery.  No nicotine patches on the day of surgery.  Do not use any "recreational" drugs for at least a week prior to your surgery.  Please be advised that the combination of cocaine and anesthesia may have negative outcomes, up to and including death. If you test positive for cocaine, your surgery will be cancelled.  On the morning of surgery brush your teeth with toothpaste and water, you may rinse your mouth with mouthwash if you wish. Do not swallow any toothpaste or mouthwash.  Do not wear jewelry, make-up, hairpins, clips or nail polish.  Do not wear lotions, powders, or perfumes.   Do not shave body from the neck down 48 hours prior to surgery just in case you cut yourself which could leave a site for infection.  Also, freshly shaved skin may become irritated if using the CHG soap.  Contact lenses, hearing aids and dentures may not be worn into surgery.  Do not bring valuables to the hospital. [facility] is not responsible for any missing/lost belongings or valuables.   Use CHG Soap or wipes as directed on instruction sheet.  Notify your doctor if there is any change in your medical condition (cold, fever, infection).  Wear comfortable clothing (specific to your surgery type) to the hospital.  Plan for stool softeners for home use; pain medications have a tendency to cause constipation. You can also help prevent constipation by eating foods high in fiber such as fruits and vegetables and drinking  plenty of fluids as your diet allows.  After surgery, you can help prevent lung complications by doing breathing exercises.  Take deep breaths and cough every 1-2 hours. Your doctor may order a device called an Incentive Spirometer to help you take deep breaths. When coughing or sneezing, hold a pillow firmly against your incision with both hands. This is called "splinting." Doing this helps protect your incision. It also  decreases belly discomfort.  If you are being admitted to the hospital overnight, leave your suitcase in the car. After surgery it may be brought to your room.  If you are being discharged the day of surgery, you will not be allowed to drive home. You will need a responsible adult (18 years or older) to drive you home and stay with you that night.   If you are taking public transportation, you will need to have a responsible adult (18 years or older) with you. Please confirm with your physician that it is acceptable to use public transportation.   Please call the [place] Dept. at [phone] if you have any questions about these instructions.  Surgery Visitation Policy:  Patients undergoing a surgery or procedure may have one family member or support person with them as long as that person is not COVID-19 positive or experiencing its symptoms.  That person may remain in the waiting area during the procedure.  Inpatient Visitation:    Visiting hours are 7 a.m. to 8 p.m. Inpatients will be allowed two visitors daily. The visitors may change each day during the patient's stay. No visitors under the age of 46. Any visitor under the age of 44 must be accompanied by an adult. The visitor must pass COVID-19 screenings, use hand sanitizer when entering and exiting the patient's room and wear a mask at all times, including in the patient's room. Patients must also wear a mask when staff or their visitor are in the room. Masking is required regardless of vaccination status.

## 2020-11-08 ENCOUNTER — Other Ambulatory Visit
Admission: RE | Admit: 2020-11-08 | Discharge: 2020-11-08 | Disposition: A | Discharge status: Home / Self Care | Payer: BLUE CROSS/BLUE SHIELD | Source: Ambulatory Visit | Attending: Obstetrics and Gynecology | Admitting: Obstetrics and Gynecology

## 2020-11-08 ENCOUNTER — Other Ambulatory Visit: Payer: Self-pay

## 2020-11-08 DIAGNOSIS — D259 Leiomyoma of uterus, unspecified: Secondary | ICD-10-CM | POA: Diagnosis not present

## 2020-11-08 DIAGNOSIS — N736 Female pelvic peritoneal adhesions (postinfective): Secondary | ICD-10-CM | POA: Diagnosis not present

## 2020-11-08 DIAGNOSIS — N838 Other noninflammatory disorders of ovary, fallopian tube and broad ligament: Secondary | ICD-10-CM | POA: Diagnosis not present

## 2020-11-08 DIAGNOSIS — Z20822 Contact with and (suspected) exposure to covid-19: Secondary | ICD-10-CM | POA: Insufficient documentation

## 2020-11-08 DIAGNOSIS — Z79899 Other long term (current) drug therapy: Secondary | ICD-10-CM | POA: Diagnosis not present

## 2020-11-08 DIAGNOSIS — Z888 Allergy status to other drugs, medicaments and biological substances status: Secondary | ICD-10-CM | POA: Diagnosis not present

## 2020-11-08 DIAGNOSIS — G8929 Other chronic pain: Secondary | ICD-10-CM | POA: Diagnosis not present

## 2020-11-08 DIAGNOSIS — Z01812 Encounter for preprocedural laboratory examination: Secondary | ICD-10-CM | POA: Insufficient documentation

## 2020-11-08 DIAGNOSIS — R102 Pelvic and perineal pain: Secondary | ICD-10-CM | POA: Diagnosis not present

## 2020-11-08 DIAGNOSIS — Z803 Family history of malignant neoplasm of breast: Secondary | ICD-10-CM | POA: Diagnosis not present

## 2020-11-08 DIAGNOSIS — Z8249 Family history of ischemic heart disease and other diseases of the circulatory system: Secondary | ICD-10-CM | POA: Diagnosis not present

## 2020-11-08 DIAGNOSIS — N809 Endometriosis, unspecified: Secondary | ICD-10-CM | POA: Diagnosis not present

## 2020-11-08 DIAGNOSIS — N941 Unspecified dyspareunia: Secondary | ICD-10-CM | POA: Diagnosis not present

## 2020-11-08 LAB — BASIC METABOLIC PANEL
Anion gap: 7 (ref 5–15)
BUN: 9 mg/dL (ref 6–20)
CO2: 24 mmol/L (ref 22–32)
Calcium: 8.6 mg/dL — ABNORMAL LOW (ref 8.9–10.3)
Chloride: 107 mmol/L (ref 98–111)
Creatinine, Ser: 0.58 mg/dL (ref 0.44–1.00)
GFR, Estimated: >60 mL/min (ref >60)
Glucose, Bld: 71 mg/dL (ref 70–99)
Potassium: 3.7 mmol/L (ref 3.5–5.1)
Sodium: 138 mmol/L (ref 135–145)

## 2020-11-08 LAB — CBC
HCT: 39 % (ref 36.0–46.0)
Hemoglobin: 12.8 g/dL (ref 12.0–15.0)
MCH: 29.6 pg (ref 26.0–34.0)
MCHC: 32.8 g/dL (ref 30.0–36.0)
MCV: 90.1 fL (ref 80.0–100.0)
Platelets: 216 10*3/uL (ref 150–400)
RBC: 4.33 MIL/uL (ref 3.87–5.11)
RDW: 12.7 % (ref 11.5–15.5)
WBC: 8.6 10*3/uL (ref 4.0–10.5)
nRBC: 0 % (ref 0.0–0.2)

## 2020-11-08 LAB — SARS CORONAVIRUS 2 (TAT 6-24 HRS): SARS Coronavirus 2: NEGATIVE

## 2020-11-09 DIAGNOSIS — Z79899 Other long term (current) drug therapy: Secondary | ICD-10-CM | POA: Diagnosis not present

## 2020-11-10 ENCOUNTER — Encounter
Admission: RE | Disposition: A | Discharge status: Home / Self Care | Payer: Self-pay | Source: Home / Self Care | Attending: Obstetrics and Gynecology

## 2020-11-10 ENCOUNTER — Ambulatory Visit: Payer: BLUE CROSS/BLUE SHIELD

## 2020-11-10 ENCOUNTER — Encounter: Payer: Self-pay | Admitting: Obstetrics and Gynecology

## 2020-11-10 ENCOUNTER — Ambulatory Visit: Payer: BLUE CROSS/BLUE SHIELD | Admitting: Anesthesiology

## 2020-11-10 ENCOUNTER — Ambulatory Visit
Admission: RE | Admit: 2020-11-10 | Discharge: 2020-11-10 | Disposition: A | Discharge status: Home / Self Care | Payer: BLUE CROSS/BLUE SHIELD | Attending: Obstetrics and Gynecology | Admitting: Obstetrics and Gynecology

## 2020-11-10 ENCOUNTER — Other Ambulatory Visit: Payer: Self-pay

## 2020-11-10 ENCOUNTER — Ambulatory Visit: Payer: BLUE CROSS/BLUE SHIELD | Admitting: Urgent Care

## 2020-11-10 DIAGNOSIS — N736 Female pelvic peritoneal adhesions (postinfective): Secondary | ICD-10-CM | POA: Diagnosis not present

## 2020-11-10 DIAGNOSIS — N809 Endometriosis, unspecified: Secondary | ICD-10-CM | POA: Insufficient documentation

## 2020-11-10 DIAGNOSIS — Z8249 Family history of ischemic heart disease and other diseases of the circulatory system: Secondary | ICD-10-CM | POA: Insufficient documentation

## 2020-11-10 DIAGNOSIS — Z20822 Contact with and (suspected) exposure to covid-19: Secondary | ICD-10-CM | POA: Diagnosis not present

## 2020-11-10 DIAGNOSIS — Z803 Family history of malignant neoplasm of breast: Secondary | ICD-10-CM | POA: Diagnosis not present

## 2020-11-10 DIAGNOSIS — R102 Pelvic and perineal pain: Secondary | ICD-10-CM | POA: Insufficient documentation

## 2020-11-10 DIAGNOSIS — Z888 Allergy status to other drugs, medicaments and biological substances status: Secondary | ICD-10-CM | POA: Insufficient documentation

## 2020-11-10 DIAGNOSIS — Z79899 Other long term (current) drug therapy: Secondary | ICD-10-CM | POA: Insufficient documentation

## 2020-11-10 DIAGNOSIS — K660 Peritoneal adhesions (postprocedural) (postinfection): Secondary | ICD-10-CM | POA: Diagnosis not present

## 2020-11-10 DIAGNOSIS — N941 Unspecified dyspareunia: Secondary | ICD-10-CM | POA: Insufficient documentation

## 2020-11-10 DIAGNOSIS — Z419 Encounter for procedure for purposes other than remedying health state, unspecified: Secondary | ICD-10-CM

## 2020-11-10 DIAGNOSIS — R935 Abnormal findings on diagnostic imaging of other abdominal regions, including retroperitoneum: Secondary | ICD-10-CM | POA: Diagnosis not present

## 2020-11-10 DIAGNOSIS — G8929 Other chronic pain: Secondary | ICD-10-CM | POA: Insufficient documentation

## 2020-11-10 DIAGNOSIS — D251 Intramural leiomyoma of uterus: Secondary | ICD-10-CM | POA: Diagnosis not present

## 2020-11-10 DIAGNOSIS — N838 Other noninflammatory disorders of ovary, fallopian tube and broad ligament: Secondary | ICD-10-CM | POA: Insufficient documentation

## 2020-11-10 DIAGNOSIS — D259 Leiomyoma of uterus, unspecified: Secondary | ICD-10-CM | POA: Insufficient documentation

## 2020-11-10 HISTORY — PX: LAPAROSCOPIC VAGINAL HYSTERECTOMY WITH SALPINGECTOMY: SHX6680

## 2020-11-10 LAB — POCT PREGNANCY, URINE: Preg Test, Ur: NEGATIVE

## 2020-11-10 LAB — ABO/RH: ABO/RH(D): A POS

## 2020-11-10 SURGERY — HYSTERECTOMY, VAGINAL, LAPAROSCOPY-ASSISTED, WITH SALPINGECTOMY
Anesthesia: General | Laterality: Bilateral

## 2020-11-10 MED ORDER — FENTANYL CITRATE (PF) 100 MCG/2ML IJ SOLN
INTRAMUSCULAR | Status: AC
  Administered 2020-11-10: 50 ug via INTRAVENOUS
  Filled 2020-11-10: qty 2

## 2020-11-10 MED ORDER — ROCURONIUM BROMIDE 100 MG/10ML IV SOLN
INTRAVENOUS | Status: DC | PRN
  Administered 2020-11-10: 10 mg via INTRAVENOUS
  Administered 2020-11-10: 50 mg via INTRAVENOUS

## 2020-11-10 MED ORDER — OXYCODONE HCL 5 MG PO TABS
ORAL_TABLET | ORAL | Status: AC
  Administered 2020-11-10: 5 mg via ORAL
  Filled 2020-11-10: qty 1

## 2020-11-10 MED ORDER — FENTANYL CITRATE (PF) 100 MCG/2ML IJ SOLN
25.0000 ug | INTRAMUSCULAR | Status: DC | PRN
  Administered 2020-11-10 (×2): 50 ug via INTRAVENOUS

## 2020-11-10 MED ORDER — FENTANYL CITRATE (PF) 100 MCG/2ML IJ SOLN
INTRAMUSCULAR | Status: AC
  Filled 2020-11-10: qty 2

## 2020-11-10 MED ORDER — OXYCODONE HCL 5 MG/5ML PO SOLN: 5.0000 mg | Freq: Once | ORAL | Status: AC | PRN

## 2020-11-10 MED ORDER — DEXAMETHASONE SODIUM PHOSPHATE 10 MG/ML IJ SOLN
INTRAMUSCULAR | Status: DC | PRN
  Administered 2020-11-10: 10 mg via INTRAVENOUS

## 2020-11-10 MED ORDER — ACETAMINOPHEN 500 MG PO TABS: 1000.0000 mg | ORAL_TABLET | ORAL | Status: AC

## 2020-11-10 MED ORDER — PROMETHAZINE HCL 25 MG/ML IJ SOLN: 6.2500 mg | INTRAMUSCULAR | Status: DC | PRN

## 2020-11-10 MED ORDER — MIDAZOLAM HCL 2 MG/2ML IJ SOLN
INTRAMUSCULAR | Status: DC | PRN
  Administered 2020-11-10: 2 mg via INTRAVENOUS

## 2020-11-10 MED ORDER — DEXMEDETOMIDINE (PRECEDEX) IN NS 20 MCG/5ML (4 MCG/ML) IV SYRINGE
PREFILLED_SYRINGE | INTRAVENOUS | Status: AC
  Filled 2020-11-10: qty 5

## 2020-11-10 MED ORDER — MEPERIDINE HCL 50 MG/ML IJ SOLN: 6.2500 mg | INTRAMUSCULAR | Status: DC | PRN

## 2020-11-10 MED ORDER — CEFAZOLIN SODIUM-DEXTROSE 2-4 GM/100ML-% IV SOLN
2.0000 g | Freq: Once | INTRAVENOUS | Status: AC
  Administered 2020-11-10: 2 g via INTRAVENOUS

## 2020-11-10 MED ORDER — GABAPENTIN 300 MG PO CAPS
ORAL_CAPSULE | ORAL | Status: AC
  Administered 2020-11-10: 300 mg via ORAL
  Filled 2020-11-10: qty 1

## 2020-11-10 MED ORDER — OXYCODONE-ACETAMINOPHEN 5-325 MG PO TABS
ORAL_TABLET | ORAL | Status: AC
  Filled 2020-11-10: qty 1

## 2020-11-10 MED ORDER — ONDANSETRON HCL 4 MG PO TABS: 8.0000 mg | ORAL_TABLET | Freq: Once | ORAL | Status: DC

## 2020-11-10 MED ORDER — OXYCODONE-ACETAMINOPHEN 5-325 MG PO TABS
1.0000 | ORAL_TABLET | ORAL | Status: DC | PRN
  Administered 2020-11-10: 1 via ORAL

## 2020-11-10 MED ORDER — LACTATED RINGERS IV SOLN: INTRAVENOUS | Status: DC

## 2020-11-10 MED ORDER — ONDANSETRON HCL 4 MG/2ML IJ SOLN
INTRAMUSCULAR | Status: DC | PRN
  Administered 2020-11-10: 4 mg via INTRAVENOUS

## 2020-11-10 MED ORDER — OXYCODONE HCL 5 MG PO TABS: 5.0000 mg | ORAL_TABLET | Freq: Once | ORAL | Status: AC | PRN

## 2020-11-10 MED ORDER — LIDOCAINE HCL (PF) 2 % IJ SOLN
INTRAMUSCULAR | Status: AC
  Filled 2020-11-10: qty 5

## 2020-11-10 MED ORDER — ORAL CARE MOUTH RINSE: 15.0000 mL | Freq: Once | OROMUCOSAL | Status: AC

## 2020-11-10 MED ORDER — SUGAMMADEX SODIUM 200 MG/2ML IV SOLN
INTRAVENOUS | Status: DC | PRN
  Administered 2020-11-10: 200 mg via INTRAVENOUS

## 2020-11-10 MED ORDER — GABAPENTIN 300 MG PO CAPS: 300.0000 mg | ORAL_CAPSULE | ORAL | Status: AC

## 2020-11-10 MED ORDER — CEFAZOLIN SODIUM-DEXTROSE 2-4 GM/100ML-% IV SOLN
INTRAVENOUS | Status: AC
  Filled 2020-11-10: qty 100

## 2020-11-10 MED ORDER — PHENYLEPHRINE HCL (PRESSORS) 10 MG/ML IV SOLN
INTRAVENOUS | Status: DC | PRN
  Administered 2020-11-10 (×2): 100 ug via INTRAVENOUS

## 2020-11-10 MED ORDER — LIDOCAINE-EPINEPHRINE 1 %-1:100000 IJ SOLN
INTRAMUSCULAR | Status: DC | PRN
  Administered 2020-11-10: 9 mL

## 2020-11-10 MED ORDER — POVIDONE-IODINE 10 % EX SWAB: 2.0000 "application " | Freq: Once | CUTANEOUS | Status: DC

## 2020-11-10 MED ORDER — LIDOCAINE HCL (CARDIAC) PF 100 MG/5ML IV SOSY
PREFILLED_SYRINGE | INTRAVENOUS | Status: DC | PRN
  Administered 2020-11-10: 100 mg via INTRAVENOUS

## 2020-11-10 MED ORDER — PROPOFOL 10 MG/ML IV BOLUS
INTRAVENOUS | Status: DC | PRN
  Administered 2020-11-10: 120 mg via INTRAVENOUS

## 2020-11-10 MED ORDER — FAMOTIDINE 20 MG PO TABS
ORAL_TABLET | ORAL | Status: AC
  Administered 2020-11-10: 20 mg via ORAL
  Filled 2020-11-10: qty 1

## 2020-11-10 MED ORDER — CHLORHEXIDINE GLUCONATE 0.12 % MT SOLN: 15.0000 mL | Freq: Once | OROMUCOSAL | Status: AC

## 2020-11-10 MED ORDER — FENTANYL CITRATE (PF) 100 MCG/2ML IJ SOLN
INTRAMUSCULAR | Status: DC | PRN
  Administered 2020-11-10 (×2): 25 ug via INTRAVENOUS
  Administered 2020-11-10: 50 ug via INTRAVENOUS

## 2020-11-10 MED ORDER — ACETAMINOPHEN 500 MG PO TABS
ORAL_TABLET | ORAL | Status: AC
  Administered 2020-11-10: 1000 mg via ORAL
  Filled 2020-11-10: qty 2

## 2020-11-10 MED ORDER — FAMOTIDINE 20 MG PO TABS: 20.0000 mg | ORAL_TABLET | Freq: Once | ORAL | Status: AC

## 2020-11-10 MED ORDER — MIDAZOLAM HCL 2 MG/2ML IJ SOLN
INTRAMUSCULAR | Status: AC
  Filled 2020-11-10: qty 2

## 2020-11-10 MED ORDER — KETOROLAC TROMETHAMINE 30 MG/ML IJ SOLN
INTRAMUSCULAR | Status: DC | PRN
  Administered 2020-11-10: 30 mg via INTRAVENOUS

## 2020-11-10 MED ORDER — PROPOFOL 10 MG/ML IV BOLUS
INTRAVENOUS | Status: AC
  Filled 2020-11-10: qty 20

## 2020-11-10 MED ORDER — EPHEDRINE SULFATE 50 MG/ML IJ SOLN
INTRAMUSCULAR | Status: DC | PRN
  Administered 2020-11-10: 5 mg via INTRAVENOUS
  Administered 2020-11-10: 10 mg via INTRAVENOUS

## 2020-11-10 MED ORDER — CHLORHEXIDINE GLUCONATE 0.12 % MT SOLN
OROMUCOSAL | Status: AC
  Administered 2020-11-10: 15 mL via OROMUCOSAL
  Filled 2020-11-10: qty 15

## 2020-11-10 MED ORDER — DEXMEDETOMIDINE (PRECEDEX) IN NS 20 MCG/5ML (4 MCG/ML) IV SYRINGE
PREFILLED_SYRINGE | INTRAVENOUS | Status: DC | PRN
  Administered 2020-11-10 (×2): 4 ug via INTRAVENOUS

## 2020-11-10 MED ORDER — BUPIVACAINE HCL 0.5 % IJ SOLN
INTRAMUSCULAR | Status: DC | PRN
  Administered 2020-11-10: 7 mL

## 2020-11-10 SURGICAL SUPPLY — 55 items
APL PRP STRL LF DISP 70% ISPRP (MISCELLANEOUS) ×1
APL SRG 38 LTWT LNG FL B (MISCELLANEOUS)
APPLICATOR ARISTA FLEXITIP XL (MISCELLANEOUS) IMPLANT
BAG DRN RND TRDRP ANRFLXCHMBR (UROLOGICAL SUPPLIES) ×1
BAG URINE DRAIN 2000ML AR STRL (UROLOGICAL SUPPLIES) ×2 IMPLANT
BLADE SURG SZ11 CARB STEEL (BLADE) ×2 IMPLANT
CATH FOLEY 2WAY  5CC 16FR (CATHETERS) ×1
CATH FOLEY 2WAY 5CC 16FR (CATHETERS) ×1
CATH ROBINSON RED A/P 16FR (CATHETERS) ×2 IMPLANT
CATH URTH 16FR FL 2W BLN LF (CATHETERS) ×1 IMPLANT
CHLORAPREP W/TINT 26 (MISCELLANEOUS) ×2 IMPLANT
COVER WAND RF STERILE (DRAPES) ×2 IMPLANT
DRAPE SURG 17X11 SM STRL (DRAPES) ×2 IMPLANT
DRSG TEGADERM 2-3/8X2-3/4 SM (GAUZE/BANDAGES/DRESSINGS) ×8 IMPLANT
ELECT REM PT RETURN 9FT ADLT (ELECTROSURGICAL) ×2
ELECTRODE REM PT RTRN 9FT ADLT (ELECTROSURGICAL) ×1 IMPLANT
FILTER LAP SMOKE EVAC STRL (MISCELLANEOUS) ×2 IMPLANT
GAUZE 4X4 16PLY RFD (DISPOSABLE) ×2 IMPLANT
GLOVE SURG SYN 8.0 (GLOVE) ×30 IMPLANT
GOWN STRL REUS W/ TWL LRG LVL3 (GOWN DISPOSABLE) ×3 IMPLANT
GOWN STRL REUS W/ TWL XL LVL3 (GOWN DISPOSABLE) ×3 IMPLANT
GOWN STRL REUS W/TWL LRG LVL3 (GOWN DISPOSABLE) ×6
GOWN STRL REUS W/TWL XL LVL3 (GOWN DISPOSABLE) ×6
GRASPER SUT TROCAR 14GX15 (MISCELLANEOUS) IMPLANT
HEMOSTAT ARISTA ABSORB 3G PWDR (HEMOSTASIS) IMPLANT
IRRIGATION STRYKERFLOW (MISCELLANEOUS) ×1 IMPLANT
IRRIGATOR STRYKERFLOW (MISCELLANEOUS) ×2
IV LACTATED RINGERS 1000ML (IV SOLUTION) ×2 IMPLANT
KIT PINK PAD W/HEAD ARE REST (MISCELLANEOUS) ×2
KIT PINK PAD W/HEAD ARM REST (MISCELLANEOUS) ×1 IMPLANT
KIT TURNOVER CYSTO (KITS) ×2 IMPLANT
LABEL OR SOLS (LABEL) ×2 IMPLANT
MANIFOLD NEPTUNE II (INSTRUMENTS) ×2 IMPLANT
NEEDLE HYPO 22GX1.5 SAFETY (NEEDLE) ×2 IMPLANT
PACK BASIN MINOR ARMC (MISCELLANEOUS) ×2 IMPLANT
PACK GYN LAPAROSCOPIC (MISCELLANEOUS) ×2 IMPLANT
PAD OB MATERNITY 4.3X12.25 (PERSONAL CARE ITEMS) ×2 IMPLANT
SHEARS HARMONIC ACE PLUS 36CM (ENDOMECHANICALS) ×2 IMPLANT
SLEEVE ENDOPATH XCEL 5M (ENDOMECHANICALS) ×4 IMPLANT
SPONGE GAUZE 2X2 8PLY STRL LF (GAUZE/BANDAGES/DRESSINGS) ×6 IMPLANT
STRIP CLOSURE SKIN 1/2X4 (GAUZE/BANDAGES/DRESSINGS) ×2 IMPLANT
SUT PDS 2-0 27IN (SUTURE) IMPLANT
SUT VIC AB 0 CT1 27 (SUTURE) ×4
SUT VIC AB 0 CT1 27XCR 8 STRN (SUTURE) ×2 IMPLANT
SUT VIC AB 0 CT1 36 (SUTURE) ×2 IMPLANT
SUT VIC AB 0 CT2 27 (SUTURE) IMPLANT
SUT VIC AB 2-0 SH 27 (SUTURE) ×2
SUT VIC AB 2-0 SH 27XBRD (SUTURE) ×1 IMPLANT
SUT VIC AB 2-0 UR6 27 (SUTURE) IMPLANT
SUT VIC AB 4-0 SH 27 (SUTURE) ×2
SUT VIC AB 4-0 SH 27XANBCTRL (SUTURE) ×1 IMPLANT
SYR 10ML LL (SYRINGE) ×2 IMPLANT
SYR CONTROL 10ML LL (SYRINGE) ×2 IMPLANT
TROCAR XCEL NON-BLD 5MMX100MML (ENDOMECHANICALS) ×2 IMPLANT
TUBING EVAC SMOKE HEATED PNEUM (TUBING) ×2 IMPLANT

## 2020-11-10 NOTE — Discharge Instructions (Addendum)
AMBULATORY SURGERY  DISCHARGE INSTRUCTIONS   1) The drugs that you were given will stay in your system until tomorrow so for the next 24 hours you should not:  A) Drive an automobile B) Make any legal decisions C) Drink any alcoholic beverage   2) You may resume regular meals tomorrow.  Today it is better to start with liquids and gradually work up to solid foods.  You may eat anything you prefer, but it is better to start with liquids, then soup and crackers, and gradually work up to solid foods.   3) Please notify your doctor immediately if you have any unusual bleeding, trouble breathing, redness and pain at the surgery site, drainage, fever, or pain not relieved by medication.    4) Additional Instructions:   PAIN MED OXYCODONE - GIVEN AT [place] AT 1:07 PM.   PAIN MED PERCOCET (OXYCODONE 5MG  & TYLENOL 325MG ) GIVEN AT [place] PM.  Please contact your physician with any problems or Same Day Surgery at [phone], Monday through Friday 6 am to 4 pm, or  at [facility] number at [phone].

## 2020-11-10 NOTE — Brief Op Note (Signed)
11/10/2020  12:06 PM  PATIENT:  Lisa Baird  45 y.o. female  PRE-OPERATIVE DIAGNOSIS:  chronic pelvic pain, dyspareunia  POST-OPERATIVE DIAGNOSIS:  chronic pelvic pain, dyspareunia Abdominopelvic adhesions PROCEDURE:  Procedure(s): LAPAROSCOPIC ASSISTED VAGINAL HYSTERECTOMY WITH SALPINGECTOMY (Bilateral) LOA  Right ovarian cystotomy  SURGEON:  Surgeon(s) and Role:    * Mitchel Delduca, Gwen Her, MD - Primary    * Benjaman Kindler, MD - Assisting  PHYSICIAN ASSISTANT: cst  ASSISTANTS: none   ANESTHESIA:   general  EBL:  60 mL IOF 800 cc, OU 225 cc BLOOD ADMINISTERED:none  DRAINS: none   LOCAL MEDICATIONS USED:  LIDOCAINE   SPECIMEN:  Source of Specimen:  cervix, uterus and bilateral tubes   DISPOSITION OF SPECIMEN:  PATHOLOGY  COUNTS:  YES  TOURNIQUET:  * No tourniquets in log *  DICTATION: .Other Dictation: Dictation Number verbal  PLAN OF CARE: Discharge to home after PACU  PATIENT DISPOSITION:  PACU - hemodynamically stable.   Delay start of Pharmacological VTE agent (>24hrs) due to surgical blood loss or risk of bleeding: not applicable

## 2020-11-10 NOTE — Transfer of Care (Signed)
Immediate Anesthesia Transfer of Care Note  Patient: Lisa Baird  Procedure(s) Performed: LAPAROSCOPIC ASSISTED VAGINAL HYSTERECTOMY WITH SALPINGECTOMY (Bilateral )  Patient Location: PACU  Anesthesia Type:General  Level of Consciousness: awake, alert  and oriented  Airway & Oxygen Therapy: Patient Spontanous Breathing and Patient connected to face mask oxygen  Post-op Assessment: Report given to RN and Post -op Vital signs reviewed and stable  Post vital signs: Reviewed and stable  Last Vitals:  Vitals Value Taken Time  BP 114/74 11/10/20 1227  Temp 36.5 C 11/10/20 1226  Pulse 82 11/10/20 1231  Resp 13 11/10/20 1231  SpO2 100 % 11/10/20 1231  Vitals shown include unvalidated device data.  Last Pain:  Vitals:   11/10/20 0824  TempSrc: Temporal  PainSc: 6       Patients Stated Pain Goal: 0 (54/98/26 4158)  Complications: No complications documented.

## 2020-11-10 NOTE — Anesthesia Procedure Notes (Signed)
Procedure Name: Intubation Date/Time: 11/10/2020 10:15 AM Performed by: Allean Found, CRNA Pre-anesthesia Checklist: Patient identified, Patient being monitored, Timeout performed, Emergency Drugs available and Suction available Patient Re-evaluated:Patient Re-evaluated prior to induction Oxygen Delivery Method: Circle system utilized Preoxygenation: Pre-oxygenation with 100% oxygen Induction Type: IV induction Ventilation: Mask ventilation without difficulty Laryngoscope Size: 3 and McGraph Grade View: Grade I Tube type: Oral Tube size: 7.0 mm Number of attempts: 1 Airway Equipment and Method: Stylet Placement Confirmation: ETT inserted through vocal cords under direct vision,  positive ETCO2 and breath sounds checked- equal and bilateral Secured at: 21 cm Tube secured with: Tape Dental Injury: Teeth and Oropharynx as per pre-operative assessment

## 2020-11-10 NOTE — Anesthesia Preprocedure Evaluation (Signed)
Anesthesia Evaluation  Patient identified by MRN, date of birth, ID band Patient awake    Reviewed: Allergy & Precautions, NPO status , Patient's Chart, lab work & pertinent test results  History of Anesthesia Complications Negative for: history of anesthetic complications  Airway Mallampati: II  TM Distance: >3 FB Neck ROM: Full    Dental no notable dental hx.    Pulmonary neg sleep apnea, neg COPD, former smoker,    breath sounds clear to auscultation- rhonchi (-) wheezing      Cardiovascular Exercise Tolerance: Good (-) hypertension(-) CAD, (-) Past MI, (-) Cardiac Stents and (-) CABG  Rhythm:Regular Rate:Normal - Systolic murmurs and - Diastolic murmurs    Neuro/Psych neg Seizures PSYCHIATRIC DISORDERS Anxiety Bipolar Disorder negative neurological ROS     GI/Hepatic negative GI ROS, Neg liver ROS,   Endo/Other  negative endocrine ROSneg diabetes  Renal/GU negative Renal ROS     Musculoskeletal negative musculoskeletal ROS (+)   Abdominal (+) - obese,   Peds  Hematology negative hematology ROS (+)   Anesthesia Other Findings Past Medical History: No date: ADD (attention deficit disorder) No date: Anxiety No date: Bipolar disorder ([place]) 04/2019: COVID-19 No date: Endometriosis   Reproductive/Obstetrics                             Anesthesia Physical Anesthesia Plan  ASA: II  Anesthesia Plan: General   Post-op Pain Management:    Induction: Intravenous  PONV Risk Score and Plan: 2 and Ondansetron, Dexamethasone and Midazolam  Airway Management Planned: Oral ETT  Additional Equipment:   Intra-op Plan:   Post-operative Plan: Extubation in OR  Informed Consent: I have reviewed the patients History and Physical, chart, labs and discussed the procedure including the risks, benefits and alternatives for the proposed anesthesia with the patient or authorized representative  who has indicated his/her understanding and acceptance.     Dental advisory given  Plan Discussed with: CRNA and Anesthesiologist  Anesthesia Plan Comments:         Anesthesia Quick Evaluation

## 2020-11-10 NOTE — Progress Notes (Signed)
Pt is scheduled for a LAVH and bil salpingectomy  All questions answered . Labs reviewed . Neg HCG  Labs reviewed . Proceed

## 2020-11-10 NOTE — Anesthesia Postprocedure Evaluation (Signed)
Anesthesia Post Note  Patient: Lisa Baird  Procedure(s) Performed: LAPAROSCOPIC ASSISTED VAGINAL HYSTERECTOMY WITH SALPINGECTOMY (Bilateral )  Patient location during evaluation: PACU Anesthesia Type: General Level of consciousness: awake and alert and oriented Pain management: pain level controlled Vital Signs Assessment: post-procedure vital signs reviewed and stable Respiratory status: spontaneous breathing, nonlabored ventilation and respiratory function stable Cardiovascular status: blood pressure returned to baseline and stable Postop Assessment: no signs of nausea or vomiting Anesthetic complications: no   No complications documented.   Last Vitals:  Vitals:   11/10/20 1331 11/10/20 1439  BP: 119/79 118/72  Pulse: 74 75  Resp: 12 16  Temp:    SpO2: 100% 100%    Last Pain:  Vitals:   11/10/20 1439  TempSrc:   PainSc: 7                  Nalah Macioce

## 2020-11-11 LAB — TYPE AND SCREEN
ABO/RH(D): A POS
Antibody Screen: POSITIVE
Unit division: 0
Unit division: 0

## 2020-11-11 LAB — BPAM RBC
Blood Product Expiration Date: 202205222359
Blood Product Expiration Date: 202205232359
Unit Type and Rh: 6200
Unit Type and Rh: 6200

## 2020-11-11 NOTE — Op Note (Signed)
NAME: Lisa Baird, Lisa Baird MEDICAL RECORD NO: [id number] ACCOUNT NO: [id number] DATE OF BIRTH: [birth year] FACILITY: [facility] LOCATION: [facility] PHYSICIAN: Boykin Nearing, MD  Operative Report   DATE OF PROCEDURE: 11/10/2020  PREOPERATIVE DIAGNOSES:   1.  Chronic pelvic pain. 2.  Dyspareunia. 3.  History of endometriosis.  POSTOPERATIVE DIAGNOSES:   1.  Chronic pelvic pain. 2.  Dyspareunia. 3.  History of endometriosis. 4.  Abdominopelvic adhesions.  PROCEDURE:   1.  Laparoscopic-assisted vaginal hysterectomy. 2.  Bilateral salpingectomy. 3.  Right ovarian cystotomy. 4.  Lysis of adhesions.  SURGEON:  Boykin Nearing, MD  FIRST ASSISTANT:  Benjaman Kindler, MD  INDICATIONS:  This is a 45 year old gravida 2, para 1, patient with a long history of chronic pelvic pain and dyspareunia.  The patient has been diagnosed with endometriosis at the age of 84.  FINDINGS:  Bilateral colonic adhesions to the right and left pelvic sidewall.  Pelvic omental adhesions to the mid abdomen to the anterior abdominal wall.  DESCRIPTION OF PROCEDURE:  After adequate general endotracheal anesthesia, the patient was placed in dorsal supine position with legs placed in the [place].  The patient's abdomen, perineum and vagina were prepped and draped in normal sterile  fashion.  Timeout was performed.  The patient did receive 2 grams of IV Ancef prior to commencement of the case for surgical prophylaxis.  A Kahn cannula was placed into the endocervix aided by anterior single tooth tenaculum to be used for uterine  manipulation during the procedure.  Straight catheterization of the bladder yielded 200 mL clear urine.  Gloves and gown were changed and attention was directed to the patient's abdomen where a 5 mm infraumbilical incision was made after injection with  0.5% Marcaine.  Upon entry into the abdominal cavity under direct visualization with the Optiview cannula, the patient's abdomen was  insufflated.  A second port placement was placed at the left lower quadrant 3 cm medial to the left anterior iliac spine  and a 5 mm trocar was advanced under direct visualization.  A third port was placed right lower quadrant, again 3 cm medial to the right anterior iliac spine and a 5 mm trocar was advanced under direct visualization.  Initial impression showed some  omental adhesions of the mid abdominal wall.  The Harmonic scalpel was brought up and these were removed.  Several colonic adhesions to the right and left sidewalls were identified and were taken down Harmonic scalpel.  Attention was then directed to the  patient's left fallopian tube, which was grasped with the fimbriated end and the mesosalpinx was dissected with the Harmonic scalpel to the level of the left cornua.  The left round ligament was opened followed by dissecting through the broad ligament.   The bladder flap was created from the left side to just past mid portion.  The left uterine artery was skeletonized, cauterized and transected without difficulty.  Similar procedure was repeated on the patient's right side, again after dissecting the  right fallopian tube away from the ovary and the round ligament was opened followed by opening the broad ligament, skeletonizing the right uterine artery, cauterizing and transecting the artery.  The rest of the bladder flap was created from the right  side.  Of note, the ureters before dissection were noted bilaterally to have normal peristaltic activity.  Attention was then directed to the patient's vagina where a weighted speculum was placed in the posterior vaginal vault and the cervix was grasped  with 2 thyroid  tenacula.  The cervix was then circumferentially injected with 1% lidocaine with 1:100,000 epinephrine.  A direct posterior colpotomy incision was made upon entry into the posterior cul-de-sac.  The peritoneal edge was tagged to the  vaginal edge.  The long-billed weighted speculum  was placed.  Uterosacral ligaments were bilaterally clamped, transected and suture ligated with 0 Vicryl suture.  Anterior cervix was circumferentially incised with the Bovie.  The cardinal ligaments were  then bilaterally clamped, transected and suture ligated with 0 Vicryl suture.  The anterior cul-de-sac was then entered sharply.  The Deaver retractor was placed within to elevate the bladder anteriorly.  The uterine arteries were bilaterally clamped,  transected and suture ligated with 0 Vicryl suture and the cervix, uterus and fallopian tubes were delivered through the incision.  All pedicles appeared hemostatic.  The vaginal cuff was then closed with a running 0 Vicryl suture.  Good cosmetic effect.   Good hemostasis noted.  Gloves and gown gowns were then changed again and attention was directed to the patient's abdomen.  After the patient's abdomen was insufflated, visualization of the cuff revealed good hemostasis.  All pedicles were hemostatic  and bilateral ureteral peristalsis was identified.  The patient's abdomen was irrigated and the patient's abdomen was then deflated.  The abdominal incisions were then closed with interrupted 4-0 Vicryl suture.  At the end of the case, straight  catheterization of the bladder yielded an additional 25 mL of urine.  There were no complications.  ESTIMATED BLOOD LOSS:  60 mL  URINE OUTPUT:  225 mL   INTRAOPERATIVE FLUIDS:  800 mL.  The patient was taken to recovery room in good condition.   PAA D: 11/10/2020 3:29:39 pm T: 11/11/2020 4:36:00 am  JOB: [id number]/ [id number]

## 2020-11-14 LAB — SURGICAL PATHOLOGY

## 2020-12-08 DIAGNOSIS — F330 Major depressive disorder, recurrent, mild: Secondary | ICD-10-CM | POA: Diagnosis not present

## 2020-12-13 DIAGNOSIS — Z79891 Long term (current) use of opiate analgesic: Secondary | ICD-10-CM | POA: Diagnosis not present

## 2020-12-14 DIAGNOSIS — F330 Major depressive disorder, recurrent, mild: Secondary | ICD-10-CM | POA: Diagnosis not present

## 2020-12-27 ENCOUNTER — Ambulatory Visit (INDEPENDENT_AMBULATORY_CARE_PROVIDER_SITE_OTHER): Payer: BLUE CROSS/BLUE SHIELD | Admitting: Obstetrics and Gynecology

## 2020-12-27 ENCOUNTER — Other Ambulatory Visit: Payer: Self-pay

## 2020-12-27 ENCOUNTER — Encounter: Payer: Self-pay | Admitting: Obstetrics and Gynecology

## 2020-12-27 VITALS — BP 131/88 | HR 76 | Ht 63.0 in | Wt 138.0 lb

## 2020-12-27 DIAGNOSIS — Z7689 Persons encountering health services in other specified circumstances: Secondary | ICD-10-CM | POA: Diagnosis not present

## 2020-12-27 DIAGNOSIS — Z9889 Other specified postprocedural states: Secondary | ICD-10-CM

## 2020-12-27 NOTE — Progress Notes (Signed)
HPI:      Lisa Baird is a 45 y.o. No obstetric history on file. who LMP was Patient's last menstrual period was 09/21/2020 (approximate).  Subjective:   She presents today to establish care at [facility].  She recently had an LAVH for uterine fibroids and a history of endometriosis.  Immediately after surgery patient had acute onset of depressive symptoms.  She returned to her office at that time and did not feel heard or listened to. She had an estradiol level and TSH performed. (These were found to be normal) She has now begun an antidepressant and is feeling much better. She is approximately 6 weeks out from her surgery.  She did have one 5-day episode of heavy bleeding but this has now resolved.  She had her postop check with and was told to wait an additional 2 weeks prior to resumption of intercourse. She is ambulating voiding and eating without problem.    Hx: The following portions of the patient's history were reviewed and updated as appropriate:             She  has a past medical history of ADD (attention deficit disorder), Anxiety, Bipolar disorder ([place]), COVID-19 (04/2019), and Endometriosis. She does not have any pertinent problems on file. She  has a past surgical history that includes Eye surgery; Appendectomy; Tonsillectomy; laparoscopy; Wisdom tooth extraction; Colonoscopy with propofol (N/A, 07/25/2020); and Laparoscopic vaginal hysterectomy with salpingectomy (Bilateral, 11/10/2020). Her family history includes Brain cancer in her paternal uncle; Diabetes in her paternal uncle; Emphysema in her mother; Heart disease in her maternal grandmother; Hyperlipidemia in her father; Hypertension in her father and maternal grandmother; Prostate cancer in her father. She  reports that she quit smoking about 3 years ago. Her smoking use included cigarettes. She has never used smokeless tobacco. She reports current alcohol use. She reports that she does not use drugs. She has a current  medication list which includes the following prescription(s): amphetamine-dextroamphetamine and trintellix. She is allergic to tegretol [carbamazepine] and lactose intolerance (gi).       Review of Systems:  Review of Systems  Constitutional: Denied constitutional symptoms, night sweats, recent illness, fatigue, fever, insomnia and weight loss.  Eyes: Denied eye symptoms, eye pain, photophobia, vision change and visual disturbance.  Ears/Nose/Throat/Neck: Denied ear, nose, throat or neck symptoms, hearing loss, nasal discharge, sinus congestion and sore throat.  Cardiovascular: Denied cardiovascular symptoms, arrhythmia, chest pain/pressure, edema, exercise intolerance, orthopnea and palpitations.  Respiratory: Denied pulmonary symptoms, asthma, pleuritic pain, productive sputum, cough, dyspnea and wheezing.  Gastrointestinal: Denied, gastro-esophageal reflux, melena, nausea and vomiting.  Genitourinary: Denied genitourinary symptoms including symptomatic vaginal discharge, pelvic relaxation issues, and urinary complaints.  Musculoskeletal: Denied musculoskeletal symptoms, stiffness, swelling, muscle weakness and myalgia.  Dermatologic: Denied dermatology symptoms, rash and scar.  Neurologic: Denied neurology symptoms, dizziness, headache, neck pain and syncope.  Psychiatric: See HPI for additional information.  Endocrine: Denied endocrine symptoms including hot flashes and night sweats.   Meds:   Current Outpatient Medications on File Prior to Visit  Medication Sig Dispense Refill   amphetamine-dextroamphetamine (ADDERALL) 20 MG tablet Take 20 mg by mouth 2 (two) times daily.     TRINTELLIX 10 MG TABS tablet Take 10 mg by mouth daily.     No current facility-administered medications on file prior to visit.          Objective:     Vitals:   12/27/20 0736  BP: 131/88  Pulse: 76   Filed Weights  12/27/20 0736  Weight: 138 lb (62.6 kg)                Assessment:    No  obstetric history on file. Patient Active Problem List   Diagnosis Date Noted   AVM (arteriovenous malformation) of colon    BRBPR (bright red blood per rectum) 05/16/2020   Vitamin D deficiency 01/26/2019   Fatigue 01/22/2019   Atopic dermatitis 01/22/2019   ADHD 01/22/2019     1. Encounter to establish care   2. Post-operative state     Patient now doing well postop.  Had acute onset of depression which is not controlled with an antidepressant.   Plan:            1.  May resume normal activities as previously directed.  2.  Continue antidepressant medication for at least 6 months-consider 1 year.  3.  Follow-up in 3 months. Orders No orders of the defined types were placed in this encounter.   No orders of the defined types were placed in this encounter.     F/U  No follow-ups on file. I spent 31 minutes involved in the care of this patient preparing to see the patient by obtaining and reviewing her medical history (including labs, imaging tests and prior procedures), documenting clinical information in the electronic health record (EHR), counseling and coordinating care plans, writing and sending prescriptions, ordering tests or procedures and directly communicating with the patient by discussing pertinent items from her history and physical exam as well as detailing my assessment and plan as noted above so that she has an informed understanding.  All of her questions were answered.  Finis Bud, M.D. 12/27/2020 8:08 AM

## 2021-01-04 DIAGNOSIS — Z79899 Other long term (current) drug therapy: Secondary | ICD-10-CM | POA: Diagnosis not present

## 2021-01-20 ENCOUNTER — Other Ambulatory Visit: Payer: Self-pay | Admitting: Gastroenterology

## 2021-01-20 DIAGNOSIS — K625 Hemorrhage of anus and rectum: Secondary | ICD-10-CM

## 2021-02-05 DIAGNOSIS — Z79899 Other long term (current) drug therapy: Secondary | ICD-10-CM | POA: Diagnosis not present

## 2021-02-19 DIAGNOSIS — Z20822 Contact with and (suspected) exposure to covid-19: Secondary | ICD-10-CM | POA: Diagnosis not present

## 2021-02-22 DIAGNOSIS — Z20822 Contact with and (suspected) exposure to covid-19: Secondary | ICD-10-CM | POA: Diagnosis not present

## 2021-03-27 ENCOUNTER — Encounter: Payer: BLUE CROSS/BLUE SHIELD | Admitting: Obstetrics and Gynecology

## 2021-03-29 ENCOUNTER — Encounter: Payer: BLUE CROSS/BLUE SHIELD | Admitting: Obstetrics and Gynecology

## 2021-04-12 DIAGNOSIS — Z79899 Other long term (current) drug therapy: Secondary | ICD-10-CM | POA: Diagnosis not present

## 2021-05-10 DIAGNOSIS — Z79899 Other long term (current) drug therapy: Secondary | ICD-10-CM | POA: Diagnosis not present

## 2021-06-05 DIAGNOSIS — Z79899 Other long term (current) drug therapy: Secondary | ICD-10-CM | POA: Diagnosis not present

## 2021-07-19 DIAGNOSIS — F411 Generalized anxiety disorder: Secondary | ICD-10-CM | POA: Diagnosis not present

## 2021-07-19 DIAGNOSIS — F909 Attention-deficit hyperactivity disorder, unspecified type: Secondary | ICD-10-CM | POA: Diagnosis not present

## 2021-07-19 DIAGNOSIS — Z79899 Other long term (current) drug therapy: Secondary | ICD-10-CM | POA: Diagnosis not present

## 2021-07-19 DIAGNOSIS — F329 Major depressive disorder, single episode, unspecified: Secondary | ICD-10-CM | POA: Diagnosis not present

## 2021-07-19 DIAGNOSIS — G4700 Insomnia, unspecified: Secondary | ICD-10-CM | POA: Diagnosis not present

## 2021-07-26 DIAGNOSIS — Z599 Problem related to housing and economic circumstances, unspecified: Secondary | ICD-10-CM | POA: Diagnosis not present

## 2021-07-26 DIAGNOSIS — F4321 Adjustment disorder with depressed mood: Secondary | ICD-10-CM | POA: Diagnosis not present

## 2021-07-26 DIAGNOSIS — T7431XA Adult psychological abuse, confirmed, initial encounter: Secondary | ICD-10-CM | POA: Diagnosis not present

## 2021-07-26 DIAGNOSIS — Z630 Problems in relationship with spouse or partner: Secondary | ICD-10-CM | POA: Diagnosis not present

## 2021-07-31 DIAGNOSIS — F4321 Adjustment disorder with depressed mood: Secondary | ICD-10-CM | POA: Diagnosis not present

## 2021-07-31 DIAGNOSIS — T7431XA Adult psychological abuse, confirmed, initial encounter: Secondary | ICD-10-CM | POA: Diagnosis not present

## 2021-07-31 DIAGNOSIS — Z599 Problem related to housing and economic circumstances, unspecified: Secondary | ICD-10-CM | POA: Diagnosis not present

## 2021-07-31 DIAGNOSIS — Z630 Problems in relationship with spouse or partner: Secondary | ICD-10-CM | POA: Diagnosis not present

## 2021-08-17 DIAGNOSIS — Z79899 Other long term (current) drug therapy: Secondary | ICD-10-CM | POA: Diagnosis not present

## 2021-08-17 DIAGNOSIS — F909 Attention-deficit hyperactivity disorder, unspecified type: Secondary | ICD-10-CM | POA: Diagnosis not present

## 2021-08-17 DIAGNOSIS — G4700 Insomnia, unspecified: Secondary | ICD-10-CM | POA: Diagnosis not present

## 2021-08-17 DIAGNOSIS — F329 Major depressive disorder, single episode, unspecified: Secondary | ICD-10-CM | POA: Diagnosis not present

## 2021-08-17 DIAGNOSIS — F411 Generalized anxiety disorder: Secondary | ICD-10-CM | POA: Diagnosis not present

## 2021-09-06 ENCOUNTER — Encounter: Payer: BLUE CROSS/BLUE SHIELD | Admitting: Obstetrics and Gynecology

## 2021-09-06 DIAGNOSIS — N926 Irregular menstruation, unspecified: Secondary | ICD-10-CM

## 2021-09-12 ENCOUNTER — Encounter: Payer: BLUE CROSS/BLUE SHIELD | Admitting: Obstetrics and Gynecology

## 2021-09-17 DIAGNOSIS — F411 Generalized anxiety disorder: Secondary | ICD-10-CM | POA: Diagnosis not present

## 2021-09-17 DIAGNOSIS — F329 Major depressive disorder, single episode, unspecified: Secondary | ICD-10-CM | POA: Diagnosis not present

## 2021-09-17 DIAGNOSIS — F909 Attention-deficit hyperactivity disorder, unspecified type: Secondary | ICD-10-CM | POA: Diagnosis not present

## 2021-09-17 DIAGNOSIS — G4700 Insomnia, unspecified: Secondary | ICD-10-CM | POA: Diagnosis not present

## 2021-09-18 ENCOUNTER — Ambulatory Visit (INDEPENDENT_AMBULATORY_CARE_PROVIDER_SITE_OTHER): Payer: BLUE CROSS/BLUE SHIELD | Admitting: Obstetrics and Gynecology

## 2021-09-18 ENCOUNTER — Other Ambulatory Visit: Payer: Self-pay

## 2021-09-18 ENCOUNTER — Encounter: Payer: Self-pay | Admitting: Obstetrics and Gynecology

## 2021-09-18 DIAGNOSIS — N939 Abnormal uterine and vaginal bleeding, unspecified: Secondary | ICD-10-CM | POA: Diagnosis not present

## 2021-09-18 DIAGNOSIS — Z79899 Other long term (current) drug therapy: Secondary | ICD-10-CM | POA: Diagnosis not present

## 2021-09-18 DIAGNOSIS — R102 Pelvic and perineal pain: Secondary | ICD-10-CM

## 2021-09-18 NOTE — Progress Notes (Signed)
Patient presents today for spotting post hysterectomy. She reports her hysterectomy was in 10/2020 and she has been spotting since along with cramping. Patient also states she have been experiencing weight gain, concerned about neck swelling. Patient states no other questions or concerns.  ?

## 2021-09-18 NOTE — Progress Notes (Signed)
HPI: ?     [name] is a 46 y.o. [id number] who LMP was Patient's last menstrual period was 09/21/2020 (approximate). ? ?Subjective:  ? ?She presents today complaining of occasional vaginal spotting and crampy pelvic pain on and off.  She had a hysterectomy just short of a year ago.  She is not currently sexually active.  The spotting does not require her to wear a pad but it is intermittent. ?Of significant note, patient has weaned off of her antidepressant medicine and was doing well.  She is now going through a separation from her husband and is under stress but states it is not nearly as bad as her previous depression and does not think she needs medication. ? ?  Hx: ?The following portions of the patient's history were reviewed and updated as appropriate: ?            She  has a past medical history of ADD (attention deficit disorder), Anxiety, Bipolar disorder ([place]), COVID-19 (04/2019), and Endometriosis. ?She does not have any pertinent problems on file. ?She  has a past surgical history that includes Eye surgery; Appendectomy; Tonsillectomy; laparoscopy; Wisdom tooth extraction; Colonoscopy with propofol (N/A, 07/25/2020); and Laparoscopic vaginal hysterectomy with salpingectomy (Bilateral, 11/10/2020). ?Her family history includes Brain cancer in her paternal uncle; Diabetes in her paternal uncle; Emphysema in her mother; Heart disease in her maternal grandmother; Hyperlipidemia in her father; Hypertension in her father and maternal grandmother; Prostate cancer in her father. ?She  reports that she quit smoking about 4 years ago. Her smoking use included cigarettes. She has never used smokeless tobacco. She reports current alcohol use. She reports that she does not use drugs. ?She has a current medication list which includes the following prescription(s): amphetamine-dextroamphetamine. ?She is allergic to tegretol [carbamazepine] and lactose intolerance (gi). ?      ?Review of Systems:  ?Review of  Systems ? ?Constitutional: Denied constitutional symptoms, night sweats, recent illness, fatigue, fever, insomnia and weight loss.  ?Eyes: Denied eye symptoms, eye pain, photophobia, vision change and visual disturbance.  ?Ears/Nose/Throat/Neck: Denied ear, nose, throat or neck symptoms, hearing loss, nasal discharge, sinus congestion and sore throat.  ?Cardiovascular: Denied cardiovascular symptoms, arrhythmia, chest pain/pressure, edema, exercise intolerance, orthopnea and palpitations.  ?Respiratory: Denied pulmonary symptoms, asthma, pleuritic pain, productive sputum, cough, dyspnea and wheezing.  ?Gastrointestinal: Denied, gastro-esophageal reflux, melena, nausea and vomiting.  ?Genitourinary: See HPI for additional information.  ?Musculoskeletal: Denied musculoskeletal symptoms, stiffness, swelling, muscle weakness and myalgia.  ?Dermatologic: Denied dermatology symptoms, rash and scar.  ?Neurologic: Denied neurology symptoms, dizziness, headache, neck pain and syncope.  ?Psychiatric: Denied psychiatric symptoms, anxiety and depression.  ?Endocrine: Denied endocrine symptoms including hot flashes and night sweats.  ? ?Meds: ?  ?Current Outpatient Medications on File Prior to Visit  ?Medication Sig Dispense Refill  ? amphetamine-dextroamphetamine (ADDERALL) 20 MG tablet Take 20 mg by mouth 2 (two) times daily.    ? ?No current facility-administered medications on file prior to visit.  ? ? ? ? ?Objective:  ?  ? ?There were no vitals filed for this visit. ?There were no vitals filed for this visit. ?  ?         Physical examination ?  Pelvic:   ?Vulva: Normal appearance.  No lesions.  ?Vagina: No lesions or abnormalities noted.  Vaginal cuff shows 2 red areas not consistent with vaginal tissue.  They appear to be tubal epithelium.  They are friable and she can differentiate touch of these areas versus  touch of the vagina without prompting.  Silver nitrate applied to one of them to see if sclerosis will help with  the bleeding.  ?Support: Normal pelvic support.  ?Urethra No masses tenderness or scarring.  ?Meatus Normal size without lesions or prolapse.  ?Cervix: Surgically absent  ?Anus: Normal exam.  No lesions.  ?Perineum: Normal exam.  No lesions.  ?      Bimanual   ?Uterus: Surgically absent  ?Adnexae: No masses.  Non-tender to palpation.  ?Cul-de-sac: Negative for abnormality.  ? ? ?        ? ?Assessment:  ?  ?[id number] ?Patient Active Problem List  ? Diagnosis Date Noted  ? AVM (arteriovenous malformation) of colon   ? BRBPR (bright red blood per rectum) 05/16/2020  ? Vitamin D deficiency 01/26/2019  ? Fatigue 01/22/2019  ? Atopic dermatitis 01/22/2019  ? ADHD 01/22/2019  ? ?  ?1. Vaginal spotting   ?2. Pelvic pain in female   ? ? Irregularity of the vaginal cuff-possible tubal epithelium.  This is most likely the source of her spotting and of her occasional pelvic cramping. ? ? ?Plan:  ?  ?       ? 1.  Expectant management at this time as patient is in a stressful time and this may not be the best for surgery.  Sclerosis of the 1 area may improve her bleeding. ? ?Would consider in the future exploratory laparoscopy to make sure no bowel or tube was attached to the upper vaginal cuff.  Then revision of vaginal cuff surgery with excision of the 2 areas noted above. ?She will contact us when and if she continues to have problems and would like more definitive management. ?Orders ?No orders of the defined types were placed in this encounter. ? ? No orders of the defined types were placed in this encounter. ?  ?  F/U ? Return for Pt to contact us if symptoms worsen. ?I spent 26 minutes involved in the care of this patient preparing to see the patient by obtaining and reviewing her medical history (including labs, imaging tests and prior procedures), documenting clinical information in the electronic health record (EHR), counseling and coordinating care plans, writing and sending prescriptions, ordering tests or procedures  and in direct communicating with the patient and medical staff discussing pertinent items from her history and physical exam. ? ?Finis Bud, M.D. ?09/18/2021 ?2:56 PM ? ? ? ? ?

## 2021-10-12 IMAGING — DX DG ABD PORTABLE 1V
2 series · 2 of 2 positions shown · non-contrast
Comparison: None.

CLINICAL DATA: Recent surgery

EXAM:
PORTABLE ABDOMEN - 1 VIEW

[abdomen supine (1 of 2)]
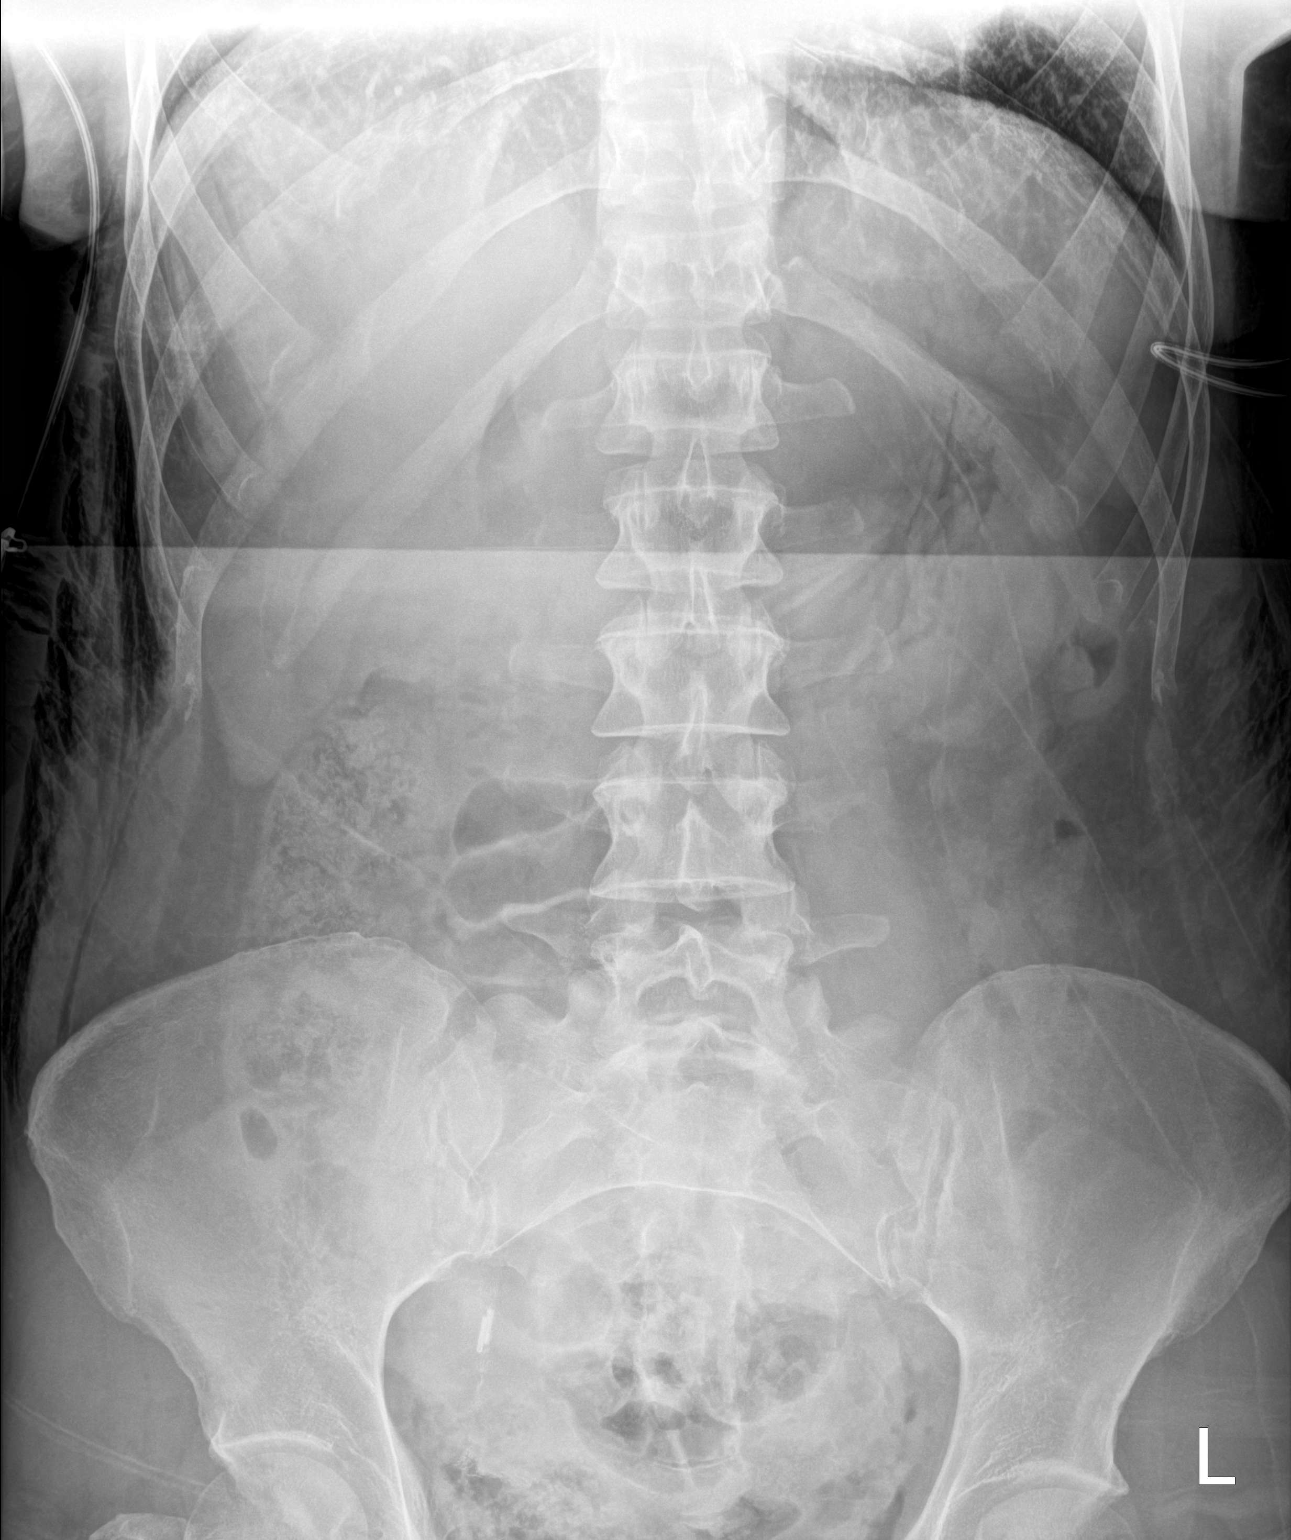

[abdomen supine (2 of 2)]
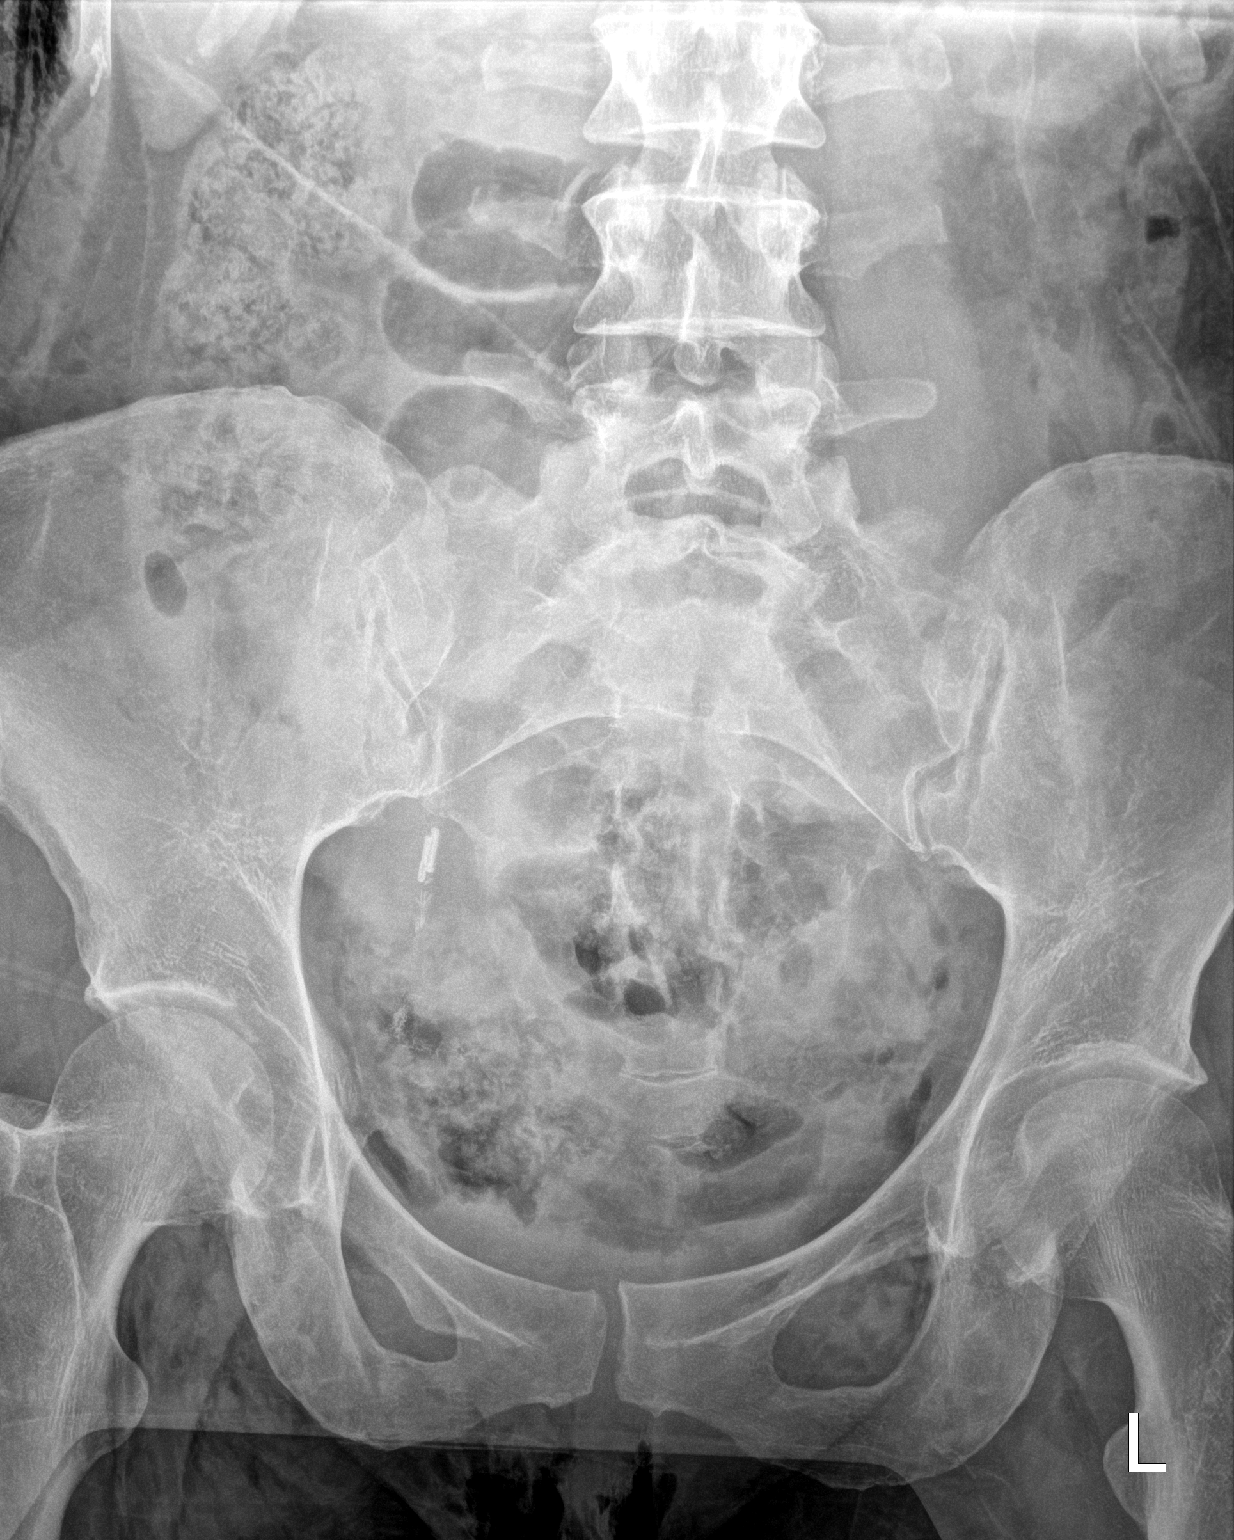

[2 of 2 positions shown; findings below may reference images not displayed]

FINDINGS: There is moderate stool throughout colon. There are clips in the
right pelvis. Beyond these clips, there is no other radiopaque
foreign body. No retained instrument or needle evident. No evident
bowel dilatation or air-fluid level to suggest bowel obstruction. No
free air. Visualized lung bases clear.
IMPRESSION: No retained needle or instrument evident. Moderate stool in colon.
No bowel obstruction or free air evident on supine examination.

## 2021-11-04 DIAGNOSIS — Z90710 Acquired absence of both cervix and uterus: Secondary | ICD-10-CM | POA: Insufficient documentation

## 2021-11-04 NOTE — Patient Instructions (Incomplete)

## 2021-11-06 ENCOUNTER — Ambulatory Visit: Payer: BLUE CROSS/BLUE SHIELD | Admitting: Nurse Practitioner

## 2021-11-07 ENCOUNTER — Encounter: Payer: Self-pay | Admitting: Nurse Practitioner

## 2021-11-07 ENCOUNTER — Ambulatory Visit: Payer: BLUE CROSS/BLUE SHIELD | Admitting: Nurse Practitioner

## 2021-11-08 NOTE — Telephone Encounter (Signed)
Patient scheduled tomorrow at 11:20 ?

## 2021-11-09 ENCOUNTER — Encounter: Payer: Self-pay | Admitting: Nurse Practitioner

## 2021-11-09 ENCOUNTER — Ambulatory Visit: Payer: BLUE CROSS/BLUE SHIELD | Admitting: Nurse Practitioner

## 2021-11-09 VITALS — BP 132/86 | HR 73 | Temp 98.5°F | Wt 138.8 lb

## 2021-11-09 DIAGNOSIS — F902 Attention-deficit hyperactivity disorder, combined type: Secondary | ICD-10-CM | POA: Diagnosis not present

## 2021-11-09 DIAGNOSIS — F4321 Adjustment disorder with depressed mood: Secondary | ICD-10-CM | POA: Diagnosis not present

## 2021-11-09 DIAGNOSIS — Z79899 Other long term (current) drug therapy: Secondary | ICD-10-CM

## 2021-11-09 DIAGNOSIS — F419 Anxiety disorder, unspecified: Secondary | ICD-10-CM | POA: Diagnosis not present

## 2021-11-09 MED ORDER — AMPHETAMINE-DEXTROAMPHETAMINE 20 MG PO TABS: 20.0000 mg | ORAL_TABLET | Freq: Two times a day (BID) | ORAL | 0 refills | Status: DC

## 2021-11-09 NOTE — Assessment & Plan Note (Signed)
Refer to feeling grief plan of care. 

## 2021-11-09 NOTE — Patient Instructions (Signed)
Living With Attention Deficit Hyperactivity Disorder If you have been diagnosed with attention deficit hyperactivity disorder (ADHD), you may be relieved that you now know why you have felt or behaved a certain way. Still, you may feel overwhelmed about the treatment ahead. You may also wonder how to get the support you need and how to deal with the condition day-to-day. With treatment and support, you can live with ADHD and manage your symptoms. How to manage lifestyle changes Managing stress Stress is your body's reaction to life changes and events, both good and bad. To cope with the stress of an ADHD diagnosis, it may help to: Learn more about ADHD. Exercise regularly. Even a short daily walk can lower stress levels. Participate in training or education programs (including social skills training classes) that teach you to deal with symptoms.  Medicines Your health care provider may suggest certain medicines if he or she feels that they will help to improve your condition. Stimulant medicines are usually prescribed to treat ADHD, and therapy may also be prescribed. It is important to: Avoid using alcohol and other substances that may prevent your medicines from working properly (may interact). Talk with your pharmacist or health care provider about all the medicines that you take, their possible side effects, and what medicines are safe to take together. Make it your goal to take part in all treatment decisions (shared decision-making). Ask about possible side effects of medicines that your health care provider recommends, and tell him or her how you feel about having those side effects. It is best if shared decision-making with your health care provider is part of your total treatment plan. Relationships To strengthen your relationships with family members while treating your condition, consider taking part in family therapy. You might also attend self-help groups alone or with a loved one. Be  honest about how your symptoms affect your relationships. Make an effort to communicate respectfully instead of fighting, and find ways to show others that you care. Psychotherapy may be useful in helping you cope with how ADHD affects your relationships. How to recognize changes in your condition The following signs may mean that your treatment is working well and your condition is improving: Consistently being on time for appointments. Being more organized at home and work. Other people noticing improvements in your behavior. Achieving goals that you set for yourself. Thinking more clearly. The following signs may mean that your treatment is not working very well: Feeling impatience or more confusion. Missing, forgetting, or being late for appointments. An increasing sense of disorganization and messiness. More difficulty in reaching goals that you set for yourself. Loved ones becoming angry or frustrated with you. Follow these instructions at home: Take over-the-counter and prescription medicines only as told by your health care provider. Check with your health care provider before taking any new medicines. Create structure and an organized atmosphere at home. For example: Make a list of tasks, then rank them from most important to least important. Work on one task at a time until your listed tasks are done. Make a daily schedule and follow it consistently every day. Use an appointment calendar, and check it 2 or 3 times a day to keep on track. Keep it with you when you leave the house. Create spaces where you keep certain things, and always put things back in their places after you use them. Keep all follow-up visits as told by your health care provider. This is important. Where to find support Talking to others    Keep emotion out of important discussions and speak in a calm, logical way. Listen closely and patiently to your loved ones. Try to understand their point of view, and try to  avoid getting defensive. Take responsibility for the consequences of your actions. Ask that others do not take your behaviors personally. Aim to solve problems as they come up, and express your feelings instead of bottling them up. Talk openly about what you need from your loved ones and how they can support you. Consider going to family therapy sessions or having your family meet with a specialist who deals with ADHD-related behavior problems. Finances Not all insurance plans cover mental health care, so it is important to check with your insurance carrier. If paying for co-pays or counseling services is a problem, search for a local or county mental health care center. Public mental health care services may be offered there at a low cost or no cost when you are not able to see a private health care provider. If you are taking medicine for ADHD, you may be able to get the generic form, which may be less expensive than brand-name medicine. Some makers of prescription medicines also offer help to patients who cannot afford the medicines that they need. Questions to ask your health care provider: What are the risks and benefits of taking medicines? Would I benefit from therapy? How often should I follow up with a health care provider? Contact a health care provider if: You have side effects from your medicines, such as: Repeated muscle twitches, coughing, or speech outbursts. Sleep problems. Loss of appetite. Depression. New or worsening behavior problems. Dizziness. Unusually fast heartbeat. Stomach pains. Headaches. Get help right away if: You have a severe reaction to a medicine. Your behavior suddenly gets worse. Summary With treatment and support, you can live with ADHD and manage your symptoms. The medicines that are most often prescribed for ADHD are stimulants. Consider taking part in family therapy or self-help groups with family members or friends. When you talk with friends  and family about your ADHD, be patient and communicate openly. Take over-the-counter and prescription medicines only as told by your health care provider. Check with your health care provider before taking any new medicines. This information is not intended to replace advice given to you by your health care provider. Make sure you discuss any questions you have with your health care provider. Document Revised: 11/12/2019 Document Reviewed: 12/15/2019 Elsevier Patient Education  2023 [employer] Inc.  

## 2021-11-09 NOTE — Assessment & Plan Note (Signed)
Feeling grief due to loss of marriage, at this time will place referral to therapy. She denies SI/HI.  PHQ 9 = 13 and GAD 7 = 9.  Provided her a list of local therapists to reach out to as well. ?
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
## 2021-11-09 NOTE — Assessment & Plan Note (Signed)
Signed for Adderall today 11/09/21. ?

## 2021-11-09 NOTE — Assessment & Plan Note (Signed)
Chronic, ongoing diagnosed by Olen Pel with psych.  At this time discussed with patient and will take over Adderall prescription, she is agreeable to every 3 months visits and contract + UDS.  Obain UDS today and signed substance agreement.  Refills sent x 3 for 11/09/21, 12/08/21, and 01/09/22.  Return in 3 months for further refills and labs. ?

## 2021-11-12 LAB — DRUG SCREEN 764883 11+OXYCO+ALC+CRT-BUND
Amphetamines, Urine: NEGATIVE ng/mL
BENZODIAZ UR QL: NEGATIVE ng/mL
Barbiturate: NEGATIVE ng/mL
Cannabinoid Quant, Ur: NEGATIVE ng/mL
Cocaine (Metabolite): NEGATIVE ng/mL
Creatinine: 99.2 mg/dL (ref 20.0–300.0)
Ethanol: NEGATIVE %
Meperidine: NEGATIVE ng/mL
Methadone Screen, Urine: NEGATIVE ng/mL
OPIATE SCREEN URINE: NEGATIVE ng/mL
Oxycodone/Oxymorphone, Urine: NEGATIVE ng/mL
Phencyclidine: NEGATIVE ng/mL
Propoxyphene: NEGATIVE ng/mL
Tramadol: NEGATIVE ng/mL
pH, Urine: 5.9 (ref 4.5–8.9)

## 2021-12-09 ENCOUNTER — Other Ambulatory Visit: Payer: Self-pay | Admitting: Nurse Practitioner

## 2021-12-11 NOTE — Telephone Encounter (Signed)
Please refuse. RX should already be at the pharmacy. Will send a mychart message to the patient.

## 2021-12-21 ENCOUNTER — Ambulatory Visit (INDEPENDENT_AMBULATORY_CARE_PROVIDER_SITE_OTHER): Payer: BLUE CROSS/BLUE SHIELD | Admitting: Plastic Surgery

## 2021-12-21 VITALS — BP 135/95 | HR 83 | Temp 98.1°F | Ht 63.0 in | Wt 139.0 lb

## 2021-12-21 DIAGNOSIS — M546 Pain in thoracic spine: Secondary | ICD-10-CM

## 2021-12-21 DIAGNOSIS — M542 Cervicalgia: Secondary | ICD-10-CM | POA: Diagnosis not present

## 2021-12-21 DIAGNOSIS — Z803 Family history of malignant neoplasm of breast: Secondary | ICD-10-CM

## 2021-12-21 DIAGNOSIS — N62 Hypertrophy of breast: Secondary | ICD-10-CM

## 2021-12-21 DIAGNOSIS — Z6824 Body mass index (BMI) 24.0-24.9, adult: Secondary | ICD-10-CM

## 2021-12-21 DIAGNOSIS — G8929 Other chronic pain: Secondary | ICD-10-CM

## 2021-12-21 DIAGNOSIS — M5459 Other low back pain: Secondary | ICD-10-CM | POA: Diagnosis not present

## 2021-12-21 DIAGNOSIS — M545 Low back pain, unspecified: Secondary | ICD-10-CM

## 2021-12-22 NOTE — Progress Notes (Signed)
Referring Provider Venita Lick, NP [place],  [place] [address]   CC:  Breast hypertophy   Lisa Baird is an 46 y.o. female.  HPI:   The patient is a 46 y.o. female with a history of mammary hyperplasia for several years.  She has extremely large breasts causing symptoms that include the following: Back pain in the upper and lower back, including neck pain. She pulls or pins her bra straps to provide better lift and relief of the pressure and pain. She notices relief by holding her breast up manually.  Her shoulder straps cause grooves and pain and pressure that requires padding for relief. Pain medication is sometimes required with motrin and tylenol.  Activities that are hindered by enlarged breasts include: exercise and running.  She has tried supportive clothing as well as fitted bras without improvement.     Mammogram history: 2 years ago.  Family history of breast cancer:  yes.  Tobacco use:  no.   The patient expresses the desire to pursue surgical intervention.  The BMI = 24.  Preoperative bra size = DD cup.   Allergies  Allergen Reactions   Tegretol [Carbamazepine] Swelling and Rash   Lactose Intolerance (Gi)     GI Distress    Outpatient Encounter Medications as of 12/21/2021  Medication Sig   amphetamine-dextroamphetamine (ADDERALL) 20 MG tablet Take 1 tablet (20 mg total) by mouth 2 (two) times daily.   amphetamine-dextroamphetamine (ADDERALL) 20 MG tablet Take 1 tablet (20 mg total) by mouth 2 (two) times daily.   [START ON 01/09/2022] amphetamine-dextroamphetamine (ADDERALL) 20 MG tablet Take 1 tablet (20 mg total) by mouth 2 (two) times daily.   No facility-administered encounter medications on file as of 12/21/2021.     Past Medical History:  Diagnosis Date   ADD (attention deficit disorder)    Anxiety    Bipolar disorder ([place])    COVID-19 04/2019   Endometriosis     Past Surgical History:  Procedure Laterality Date   ABDOMINAL HYSTERECTOMY      APPENDECTOMY     COLONOSCOPY WITH PROPOFOL N/A 07/25/2020   Procedure: COLONOSCOPY WITH PROPOFOL;  Surgeon: Virgel Manifold, MD;  Location: [place];  Service: Endoscopy;  Laterality: N/A;   EYE SURGERY     LAPAROSCOPIC VAGINAL HYSTERECTOMY WITH SALPINGECTOMY Bilateral 11/10/2020   Procedure: LAPAROSCOPIC ASSISTED VAGINAL HYSTERECTOMY WITH SALPINGECTOMY;  Surgeon: Schermerhorn, Gwen Her, MD;  Location: [facility];  Service: Gynecology;  Laterality: Bilateral;   LAPAROSCOPY     Endometriosis   TONSILLECTOMY     WISDOM TOOTH EXTRACTION      Family History  Problem Relation Age of Onset   Emphysema Mother    Prostate cancer Father    Hyperlipidemia Father    Hypertension Father    Diabetes Paternal Uncle    Brain cancer Paternal Uncle    Hypertension Maternal Grandmother    Heart disease Maternal Grandmother     Social History   Social History Narrative   Not on file     Review of Systems General: Denies fevers, chills, weight loss CV: Denies chest pain, shortness of breath, palpitations   Physical Exam    12/21/2021   11:02 AM 11/09/2021   12:05 PM 11/09/2021   11:26 AM  Vitals with BMI  Height '5\' 3"'$     Weight 139 lbs    BMI 75.64    Systolic [phone]  Diastolic 95 86 96  Pulse 83  73  General:  No acute distress,  Alert and oriented, Non-Toxic, Normal speech and affect Breast: No easily palpable breast masses on physical exam, significant breast ptosis and macromastia. Her breasts are extremely large and fairly symmetric.  She has hyperpigmentation of the inframammary area on both sides.  The sternal to nipple distance on the right is 32 cm and the left is 32 cm.  The IMF distance is 11 cm on the right and 11 cm on the left.  Base width is 16 bilaterally. Assessment/Plan The patient is going to try PT and get an updated mammogram but she is an excellent candidate for breast reduction in the future.  The estimated excess breast tissue to be removed  at the time of surgery = 500 grams on the left and 500 grams on the right. Lennice Sites 12/22/2021, 4:06 PM

## 2021-12-25 ENCOUNTER — Telehealth: Payer: Self-pay | Admitting: *Deleted

## 2021-12-25 DIAGNOSIS — F3111 Bipolar disorder, current episode manic without psychotic features, mild: Secondary | ICD-10-CM | POA: Diagnosis not present

## 2021-12-25 DIAGNOSIS — F330 Major depressive disorder, recurrent, mild: Secondary | ICD-10-CM | POA: Diagnosis not present

## 2021-12-25 NOTE — Telephone Encounter (Signed)
Call placed to pt regarding question about virtual PT. Advised virtual PT may not satisfy the insurance requirement and we will not know until it can be submitted. Pt states she has appt for in person PT (from referral) this month and will continue with that.

## 2022-01-03 ENCOUNTER — Encounter: Payer: Self-pay | Admitting: Physical Therapy

## 2022-01-03 ENCOUNTER — Ambulatory Visit: Payer: BLUE CROSS/BLUE SHIELD | Attending: Plastic Surgery | Admitting: Physical Therapy

## 2022-01-03 ENCOUNTER — Other Ambulatory Visit: Payer: Self-pay

## 2022-01-03 DIAGNOSIS — R293 Abnormal posture: Secondary | ICD-10-CM | POA: Diagnosis not present

## 2022-01-03 DIAGNOSIS — F3111 Bipolar disorder, current episode manic without psychotic features, mild: Secondary | ICD-10-CM | POA: Diagnosis not present

## 2022-01-03 DIAGNOSIS — M6281 Muscle weakness (generalized): Secondary | ICD-10-CM | POA: Diagnosis not present

## 2022-01-03 DIAGNOSIS — R29898 Other symptoms and signs involving the musculoskeletal system: Secondary | ICD-10-CM | POA: Diagnosis not present

## 2022-01-03 DIAGNOSIS — G8929 Other chronic pain: Secondary | ICD-10-CM | POA: Insufficient documentation

## 2022-01-03 DIAGNOSIS — M546 Pain in thoracic spine: Secondary | ICD-10-CM | POA: Diagnosis not present

## 2022-01-03 DIAGNOSIS — N62 Hypertrophy of breast: Secondary | ICD-10-CM | POA: Diagnosis not present

## 2022-01-03 NOTE — Therapy (Signed)
OUTPATIENT PHYSICAL THERAPY SHOULDER EVALUATION   Patient Name: Lisa Baird MRN: [id number] DOB:April 13, 1976, 46 y.o., female Today's Date: 01/03/2022   PT End of Session - 01/03/22 1011     Visit Number 1    Number of Visits 7    Date for PT Re-Evaluation 02/21/22    Authorization Type BCBS    PT Start Time 0932    PT Stop Time 1010    PT Time Calculation (min) 38 min    Activity Tolerance Patient tolerated treatment well    Behavior During Therapy [facility] for tasks assessed/performed             Past Medical History:  Diagnosis Date   ADD (attention deficit disorder)    Anxiety    Bipolar disorder ([place])    COVID-19 04/2019   Endometriosis    Past Surgical History:  Procedure Laterality Date   ABDOMINAL HYSTERECTOMY     APPENDECTOMY     COLONOSCOPY WITH PROPOFOL N/A 07/25/2020   Procedure: COLONOSCOPY WITH PROPOFOL;  Surgeon: Virgel Manifold, MD;  Location: [facility];  Service: Endoscopy;  Laterality: N/A;   EYE SURGERY     LAPAROSCOPIC VAGINAL HYSTERECTOMY WITH SALPINGECTOMY Bilateral 11/10/2020   Procedure: LAPAROSCOPIC ASSISTED VAGINAL HYSTERECTOMY WITH SALPINGECTOMY;  Surgeon: Schermerhorn, Gwen Her, MD;  Location: [facility];  Service: Gynecology;  Laterality: Bilateral;   LAPAROSCOPY     Endometriosis   TONSILLECTOMY     WISDOM TOOTH EXTRACTION     Patient Active Problem List   Diagnosis Date Noted   Feeling grief 11/09/2021   Anxiety 11/09/2021   Controlled substance agreement signed 11/09/2021   History of total hysterectomy with bilateral salpingo-oophorectomy (BSO) 11/04/2021   AVM (arteriovenous malformation) of colon    Vitamin D deficiency 01/26/2019   Atopic dermatitis 01/22/2019   ADHD 01/22/2019     PCP: Venita Lick, NP  REFERRING PROVIDER: Lennice Sites, MD  REFERRING DIAG: Breast hypertrophy [N62], Chronic bilateral thoracic back pain [M54.6, G89.29]  Rationale for Evaluation and Treatment Rehabilitation  THERAPY  DIAG:   Abnormal posture  Muscle weakness (generalized)  Other symptoms and signs involving the musculoskeletal system   ONSET DATE: Past year it has become unmanageable, but pain has been present since pt was 46 y.o   SUBJECTIVE:                                                                                                                                                                                           SUBJECTIVE STATEMENT: Pt is a 46 y.o female that presents with acute on chronic thoracic pain secondary to mammary hyperplasia. She reports  wearing two bras to help provide relief. She is currently taking over the counter medication for intermittent relief. She states that it has limited her ability to be physically active due to the pain. She also reports restlessness with sleep.   PERTINENT HISTORY:  ADD, Anxiety, Bipolar disorder, COVID, Endometriosis.   PAIN:  Are you having pain? Yes: NPRS scale: 7/10 Pain location: Cervical, thoracic spine.  Pain description: Intermittent sharp pain, constant ache.  Aggravating factors: Sitting, bending, physical activity, laying in one position.  Relieving factors: Manually holding up breasts, icy hot.   PRECAUTIONS: None  WEIGHT BEARING RESTRICTIONS No  FALLS:  Has patient fallen in last 6 months? No  LIVING ENVIRONMENT: Lives with: lives alone Lives in: House/apartment Stairs: Yes: Internal: 10 steps; none Has following equipment at home: None  OCCUPATION: Freight forwarder at [employer] in [place].   PLOF: Independent  PATIENT GOALS Pt would like to reduce pain in her thoracic spine.   OBJECTIVE:   DIAGNOSTIC FINDINGS:  None  COGNITION:  Overall cognitive status: Within functional limits for tasks assessed     SENSATION: WFL  POSTURE: Rounded posture, forward head.   UPPER EXTREMITY ROM:   Active ROM Right eval Left eval  Shoulder flexion [facility]  Shoulder extension [facility]  Shoulder abduction [facility]   Shoulder adduction [facility]  Shoulder internal rotation [facility] WFL  Shoulder external rotation WFL WFL  (Blank rows = not tested)  UPPER EXTREMITY MMT:  MMT Right eval Left eval  Shoulder flexion 4- 4-  Shoulder extension    Shoulder abduction 4+ 4+  Shoulder adduction    Shoulder internal rotation 4 4  Shoulder external rotation 4 4+  Middle trapezius 4 4  Lower trapezius 4 4  (Blank rows = not tested)  JOINT MOBILITY TESTING:  Hypomobility in C6-T3   PALPATION:  Tenderness with palpation to rhomboids, traps and paraspinals. Pt also reports tenderness with grade II joint mobs.    TODAY'S TREATMENT:  See HEP below.    PATIENT EDUCATION: Education details: Educated pt on anatomy and physiology of current symptoms, diagnosis, prognosis, HEP,  and POC. Person educated: Patient Education method: Explanation, Demonstration, Tactile cues, Verbal cues, and Handouts Education comprehension: verbalized understanding and returned demonstration   HOME EXERCISE PROGRAM: Access Code: [email or web address] URL: [email or web address] Date: 01/03/2022 Prepared by: Rudi Heap  Exercises - Seated Scapular Retraction  - 2 x daily - 7 x weekly - 3 sets - 10 reps - 5 hold - Cat Cow  - 2 x daily - 7 x weekly - 3 sets - 10 reps - 5 hold - Quadruped Full Range Thoracic Rotation with Reach  - 2 x daily - 7 x weekly - 3 sets - 10 reps - 5 hold - Seated Upper Trapezius Stretch  - 1 x daily - 7 x weekly - 2 sets - 2 reps - 30 hold  ASSESSMENT:  CLINICAL IMPRESSION: Patient is a 46 y.o. F who was seen today for physical therapy evaluation and treatment for Breast hypertrophy resulting in thoracic pain and weakness. Pt is limited with physical activity due to increased pain.   OBJECTIVE IMPAIRMENTS decreased activity tolerance, decreased endurance, decreased strength, impaired UE functional use, and pain.   ACTIVITY LIMITATIONS carrying, lifting, bending, sleeping, and reach over  head  PARTICIPATION LIMITATIONS: cleaning, laundry, and occupation  PERSONAL FACTORS 3+ comorbidities: Anxiety, ADD, Bipolar disorder  are also affecting patient's functional outcome.   REHAB POTENTIAL: Good  CLINICAL DECISION MAKING: Stable/uncomplicated  EVALUATION COMPLEXITY: Low   GOALS: Goals reviewed with patient? No  SHORT TERM GOALS: Target date: 01/24/2022  (Remove Blue Hyperlink)  Pt will be I and compliant with initial HEP. Baseline: provided at eval Goal status: INITIAL 2. Pt will be able to self correct posture with minimal VC.   Baseline:  Goal Status: INITIAL    LONG TERM GOALS: Target date: 02/14/2022  (Remove Blue Hyperlink)  Pt will be independent with advanced HEP to continue to address postural limitations and muscle imbalances. Baseline: not provided Goal status: INITIAL Pt will report < 2/10 pain with OH reaching.  Baseline:  Goal status: INITIAL  2.  Pt will be able to sleep 6 consecutive hours without waking up due to pain.  Baseline: 2 hours  Goal status: INITIAL  3.  Pt will be able to work a full 8 hour shift without having to readjust.  Baseline:  Goal status: INITIAL  PLAN: PT FREQUENCY: 1x/week  PT DURATION: 6 weeks  PLANNED INTERVENTIONS: Therapeutic exercises, Therapeutic activity, Neuromuscular re-education, Balance training, Gait training, Patient/Family education, Joint manipulation, Joint mobilization, Dry Needling, Electrical stimulation, Spinal manipulation, Spinal mobilization, Cryotherapy, Moist heat, Taping, Vasopneumatic device, Traction, Ultrasound, and Manual therapy  PLAN FOR NEXT SESSION: Assess HEP/update PRN, continue to progress functional mobility, strengthen periscapular muscles. Decrease patients pain and help minimize pain with OH lifting, carrying.     Lynden Ang, PT 01/03/2022, 10:14 AM

## 2022-01-10 ENCOUNTER — Ambulatory Visit: Payer: BLUE CROSS/BLUE SHIELD

## 2022-01-16 ENCOUNTER — Other Ambulatory Visit: Payer: Self-pay | Admitting: Nurse Practitioner

## 2022-01-16 DIAGNOSIS — N62 Hypertrophy of breast: Secondary | ICD-10-CM

## 2022-01-16 DIAGNOSIS — Z1231 Encounter for screening mammogram for malignant neoplasm of breast: Secondary | ICD-10-CM

## 2022-01-16 DIAGNOSIS — G8929 Other chronic pain: Secondary | ICD-10-CM

## 2022-01-16 NOTE — Therapy (Unsigned)
OUTPATIENT PHYSICAL THERAPY TREATMENT NOTE   Patient Name: Jendaya Gossett MRN: [id number] DOB:10-20-1975, 46 y.o., female Today's Date: 01/17/2022  PCP: Venita Lick, NP REFERRING PROVIDER: Lennice Sites, MD  END OF SESSION:   PT End of Session - 01/17/22 1840     Visit Number 2    Number of Visits 7    Date for PT Re-Evaluation 02/21/22    Authorization Type BCBS    PT Start Time 1840    PT Stop Time 1910    PT Time Calculation (min) 30 min    Activity Tolerance Patient tolerated treatment well    Behavior During Therapy [facility] for tasks assessed/performed             Past Medical History:  Diagnosis Date   ADD (attention deficit disorder)    Anxiety    Bipolar disorder ([place])    COVID-19 04/2019   Endometriosis    Past Surgical History:  Procedure Laterality Date   ABDOMINAL HYSTERECTOMY     APPENDECTOMY     COLONOSCOPY WITH PROPOFOL N/A 07/25/2020   Procedure: COLONOSCOPY WITH PROPOFOL;  Surgeon: Virgel Manifold, MD;  Location: [place];  Service: Endoscopy;  Laterality: N/A;   EYE SURGERY     LAPAROSCOPIC VAGINAL HYSTERECTOMY WITH SALPINGECTOMY Bilateral 11/10/2020   Procedure: LAPAROSCOPIC ASSISTED VAGINAL HYSTERECTOMY WITH SALPINGECTOMY;  Surgeon: Schermerhorn, Gwen Her, MD;  Location: [facility];  Service: Gynecology;  Laterality: Bilateral;   LAPAROSCOPY     Endometriosis   TONSILLECTOMY     WISDOM TOOTH EXTRACTION     Patient Active Problem List   Diagnosis Date Noted   Feeling grief 11/09/2021   Anxiety 11/09/2021   Controlled substance agreement signed 11/09/2021   History of total hysterectomy with bilateral salpingo-oophorectomy (BSO) 11/04/2021   AVM (arteriovenous malformation) of colon    Vitamin D deficiency 01/26/2019   Atopic dermatitis 01/22/2019   ADHD 01/22/2019    REFERRING DIAG:  N62 (ICD-10-CM) - Breast hypertrophy  M54.6,G89.29 (ICD-10-CM) - Chronic bilateral thoracic back pain    THERAPY DIAG: Thoracic  pain   Rationale for Evaluation and Treatment Rehabilitation  PERTINENT HISTORY: The patient is a 46 y.o. female with a history of mammary hyperplasia for several years.  She has extremely large breasts causing symptoms that include the following: Back pain in the upper and lower back, including neck pain. She pulls or pins her bra straps to provide better lift and relief of the pressure and pain. She notices relief by holding her breast up manually.  Her shoulder straps cause grooves and pain and pressure that requires padding for relief. Pain medication is sometimes required with motrin and tylenol.  Activities that are hindered by enlarged breasts include: exercise and running.  She has tried supportive clothing as well as fitted bras without improvement.     Mammogram history: 2 years ago.  Family history of breast cancer:  yes.  Tobacco use:  no.   The patient expresses the desire to pursue surgical intervention  PRECAUTIONS: none  SUBJECTIVE: No changes to report  PAIN:  Are you having pain? Yes: NPRS scale: 9/10 Pain location: upper back Pain description: ache Aggravating factors: prolonged positions and postural  Relieving factors: change in positions   OBJECTIVE: (objective measures completed at initial evaluation unless otherwise dated)   OBJECTIVE:    DIAGNOSTIC FINDINGS:  None   COGNITION:           Overall cognitive status: Within functional limits for tasks assessed  SENSATION: WFL   POSTURE: Rounded posture, forward head.    UPPER EXTREMITY ROM:    Active ROM Right eval Left eval  Shoulder flexion [facility]  Shoulder extension [facility]  Shoulder abduction [facility]  Shoulder adduction [facility]  Shoulder internal rotation [facility] WFL  Shoulder external rotation WFL WFL  (Blank rows = not tested)   UPPER EXTREMITY MMT:   MMT Right eval Left eval  Shoulder flexion 4- 4-  Shoulder extension      Shoulder abduction 4+ 4+   Shoulder adduction      Shoulder internal rotation 4 4  Shoulder external rotation 4 4+  Middle trapezius 4 4  Lower trapezius 4 4  (Blank rows = not tested)   JOINT MOBILITY TESTING:  Hypomobility in C6-T3    PALPATION:  Tenderness with palpation to rhomboids, traps and paraspinals. Pt also reports tenderness with grade II joint mobs.              TODAY'S TREATMENT:  [facility] Adult PT Treatment:                                                DATE: 01/17/22 Therapeutic Exercise: UBE L1 3/3 Open book 10/10 Quadruped thoracic rotation 10/10 Quadruped cat/cow 10x Supine horizontal abduction YTB 10x Supine alternating OH flexion 10/10 Quadratus lumborum stretch 30s B Manual Therapy:  Mid thoracic mobilizations into extension 5x '@T6'$ -8 with increased mobility but continued pain       PATIENT EDUCATION: Education details: Educated pt on anatomy and physiology of current symptoms, diagnosis, prognosis, HEP,  and POC. Person educated: Patient Education method: Explanation, Demonstration, Tactile cues, Verbal cues, and Handouts Education comprehension: verbalized understanding and returned demonstration     HOME EXERCISE PROGRAM: Access Code: [email or web address] URL: [email or web address] Date: 01/03/2022 Prepared by: Rudi Heap   Exercises - Seated Scapular Retraction  - 2 x daily - 7 x weekly - 3 sets - 10 reps - 5 hold - Cat Cow  - 2 x daily - 7 x weekly - 3 sets - 10 reps - 5 hold - Quadruped Full Range Thoracic Rotation with Reach  - 2 x daily - 7 x weekly - 3 sets - 10 reps - 5 hold - Seated Upper Trapezius Stretch  - 1 x daily - 7 x weekly - 2 sets - 2 reps - 30 hold   ASSESSMENT:   CLINICAL IMPRESSION: Todays session focused on exercises to increase thoracic strength and mobility.  Emphasis placed on rotational motions and relieving pain mid thoracic region with limited relief with manual interventions.  Upper body and arm weakness evident.   OBJECTIVE IMPAIRMENTS  decreased activity tolerance, decreased endurance, decreased strength, impaired UE functional use, and pain.    ACTIVITY LIMITATIONS carrying, lifting, bending, sleeping, and reach over head   PARTICIPATION LIMITATIONS: cleaning, laundry, and occupation   PERSONAL FACTORS 3+ comorbidities: Anxiety, ADD, Bipolar disorder  are also affecting patient's functional outcome.    REHAB POTENTIAL: Good   CLINICAL DECISION MAKING: Stable/uncomplicated   EVALUATION COMPLEXITY: Low     GOALS: Goals reviewed with patient? No   SHORT TERM GOALS: Target date: 01/24/2022  (Remove Blue Hyperlink)   Pt will be I and compliant with initial HEP. Baseline: provided at eval Goal status: INITIAL 2. Pt will be able to self correct posture with minimal VC.  Baseline:  Goal Status: INITIAL      LONG TERM GOALS: Target date: 02/14/2022  (Remove Blue Hyperlink)   Pt will be independent with advanced HEP to continue to address postural limitations and muscle imbalances. Baseline: not provided Goal status: INITIAL Pt will report < 2/10 pain with OH reaching.  Baseline:  Goal status: INITIAL   2.  Pt will be able to sleep 6 consecutive hours without waking up due to pain.  Baseline: 2 hours  Goal status: INITIAL   3.  Pt will be able to work a full 8 hour shift without having to readjust.  Baseline:  Goal status: INITIAL   PLAN: PT FREQUENCY: 1x/week   PT DURATION: 6 weeks   PLANNED INTERVENTIONS: Therapeutic exercises, Therapeutic activity, Neuromuscular re-education, Balance training, Gait training, Patient/Family education, Joint manipulation, Joint mobilization, Dry Needling, Electrical stimulation, Spinal manipulation, Spinal mobilization, Cryotherapy, Moist heat, Taping, Vasopneumatic device, Traction, Ultrasound, and Manual therapy   PLAN FOR NEXT SESSION: Assess HEP/update PRN, continue to progress functional mobility, strengthen periscapular muscles. Decrease patients pain and  help minimize pain with OH lifting, carrying. Assess benefit of manual   Lanice Shirts, PT 01/17/2022, 7:24 PM

## 2022-01-17 ENCOUNTER — Ambulatory Visit: Payer: BLUE CROSS/BLUE SHIELD | Attending: Plastic Surgery

## 2022-01-17 DIAGNOSIS — R293 Abnormal posture: Secondary | ICD-10-CM | POA: Insufficient documentation

## 2022-01-17 DIAGNOSIS — M6281 Muscle weakness (generalized): Secondary | ICD-10-CM | POA: Insufficient documentation

## 2022-01-17 DIAGNOSIS — R29898 Other symptoms and signs involving the musculoskeletal system: Secondary | ICD-10-CM | POA: Diagnosis not present

## 2022-01-24 ENCOUNTER — Ambulatory Visit: Payer: BLUE CROSS/BLUE SHIELD

## 2022-01-24 DIAGNOSIS — Z1231 Encounter for screening mammogram for malignant neoplasm of breast: Secondary | ICD-10-CM

## 2022-01-31 ENCOUNTER — Ambulatory Visit: Payer: BLUE CROSS/BLUE SHIELD

## 2022-01-31 ENCOUNTER — Other Ambulatory Visit: Payer: Self-pay | Admitting: Nurse Practitioner

## 2022-01-31 DIAGNOSIS — M6281 Muscle weakness (generalized): Secondary | ICD-10-CM

## 2022-01-31 DIAGNOSIS — R293 Abnormal posture: Secondary | ICD-10-CM | POA: Diagnosis not present

## 2022-01-31 DIAGNOSIS — Z1231 Encounter for screening mammogram for malignant neoplasm of breast: Secondary | ICD-10-CM

## 2022-01-31 DIAGNOSIS — R29898 Other symptoms and signs involving the musculoskeletal system: Secondary | ICD-10-CM | POA: Diagnosis not present

## 2022-01-31 NOTE — Therapy (Signed)
OUTPATIENT PHYSICAL THERAPY TREATMENT NOTE   Patient Name: Lisa Baird MRN: [id number] DOB:07/27/1975, 46 y.o., female Today's Date: 01/31/2022  PCP: Venita Lick, NP REFERRING PROVIDER: Lennice Sites, MD  END OF SESSION:   PT End of Session - 01/31/22 1319     Visit Number 3    Number of Visits 7    Date for PT Re-Evaluation 02/21/22    Authorization Type BC[employer]    PT Start Time 1320    PT Stop Time 1400    PT Time Calculation (min) 40 min    Activity Tolerance Patient tolerated treatment well    Behavior During Therapy WFL for tasks assessed/performed              Past Medical History:  Diagnosis Date   ADD (attention deficit disorder)    Anxiety    Bipolar disorder ([place])    COVID-19 04/2019   Endometriosis    Past Surgical History:  Procedure Laterality Date   ABDOMINAL HYSTERECTOMY     APPENDECTOMY     COLONOSCOPY WITH PROPOFOL N/A 07/25/2020   Procedure: COLONOSCOPY WITH PROPOFOL;  Surgeon: Virgel Manifold, MD;  Location: [facility];  Service: Endoscopy;  Laterality: N/A;   EYE SURGERY     LAPAROSCOPIC VAGINAL HYSTERECTOMY WITH SALPINGECTOMY Bilateral 11/10/2020   Procedure: LAPAROSCOPIC ASSISTED VAGINAL HYSTERECTOMY WITH SALPINGECTOMY;  Surgeon: Schermerhorn, Gwen Her, MD;  Location: [facility];  Service: Gynecology;  Laterality: Bilateral;   LAPAROSCOPY     Endometriosis   TONSILLECTOMY     WISDOM TOOTH EXTRACTION     Patient Active Problem List   Diagnosis Date Noted   Feeling grief 11/09/2021   Anxiety 11/09/2021   Controlled substance agreement signed 11/09/2021   History of total hysterectomy with bilateral salpingo-oophorectomy (BSO) 11/04/2021   AVM (arteriovenous malformation) of colon    Vitamin D deficiency 01/26/2019   Atopic dermatitis 01/22/2019   ADHD 01/22/2019    REFERRING DIAG:  N62 (ICD-10-CM) - Breast hypertrophy  M54.6,G89.29 (ICD-10-CM) - Chronic bilateral thoracic back pain    THERAPY DIAG: Thoracic  pain   Rationale for Evaluation and Treatment Rehabilitation  PERTINENT HISTORY: The patient is a 46 y.o. female with a history of mammary hyperplasia for several years.  She has extremely large breasts causing symptoms that include the following: Back pain in the upper and lower back, including neck pain. She pulls or pins her bra straps to provide better lift and relief of the pressure and pain. She notices relief by holding her breast up manually.  Her shoulder straps cause grooves and pain and pressure that requires padding for relief. Pain medication is sometimes required with motrin and tylenol.  Activities that are hindered by enlarged breasts include: exercise and running.  She has tried supportive clothing as well as fitted bras without improvement.     Mammogram history: 2 years ago.  Family history of breast cancer:  yes.  Tobacco use:  no.   The patient expresses the desire to pursue surgical intervention  PRECAUTIONS: none  SUBJECTIVE: Continue levels of discomfort at 7-8/10  PAIN:  Are you having pain? Yes: NPRS scale: 9/10 Pain location: upper back Pain description: ache Aggravating factors: prolonged positions and postural  Relieving factors: change in positions   OBJECTIVE: (objective measures completed at initial evaluation unless otherwise dated)   OBJECTIVE:    DIAGNOSTIC FINDINGS:  None   COGNITION:           Overall cognitive status: Within functional limits for tasks  assessed                                  SENSATION: WFL   POSTURE: Rounded posture, forward head.    UPPER EXTREMITY ROM:    Active ROM Right eval Left eval  Shoulder flexion [facility]  Shoulder extension [facility]  Shoulder abduction [facility]  Shoulder adduction [facility]  Shoulder internal rotation [facility] WFL  Shoulder external rotation WFL WFL  (Blank rows = not tested)   UPPER EXTREMITY MMT:   MMT Right eval Left eval  Shoulder flexion 4- 4-  Shoulder extension      Shoulder  abduction 4+ 4+  Shoulder adduction      Shoulder internal rotation 4 4  Shoulder external rotation 4 4+  Middle trapezius 4 4  Lower trapezius 4 4  (Blank rows = not tested)   JOINT MOBILITY TESTING:  Hypomobility in C6-T3    PALPATION:  Tenderness with palpation to rhomboids, traps and paraspinals. Pt also reports tenderness with grade II joint mobs.              TODAY'S TREATMENT:  [facility] Adult PT Treatment:                                                DATE: 01/31/22 Therapeutic Exercise: UBE L1.5 3/3 Supine flexion w/bar emphasis on breathing pattern Supine hor abd YTB 15x Open book 10/10 (breathing patterns) Quadruped thoracic rotation 10/10 Quadruped alt UE/LE 10/10 Supine horizontal abduction YTB 10x Supine alternating OH flexion 10/10 Quadratus lumborum stretch 30s B Prone flexion 15x Prone extension 15x Prone Ws 15x Sitting ER YTB 15x Wall angels 15x Ball wall flexion with B lift off 15x   [facility] Adult PT Treatment:                                                DATE: 01/17/22 Therapeutic Exercise: UBE L1 3/3 Open book 10/10 Quadruped thoracic rotation 10/10 Quadruped cat/cow 10x Supine horizontal abduction YTB 10x Supine alternating OH flexion 10/10 Quadratus lumborum stretch 30s B Manual Therapy:  Mid thoracic mobilizations into extension 5x '@T6'$ -8 with increased mobility but continued pain       PATIENT EDUCATION: Education details: Educated pt on anatomy and physiology of current symptoms, diagnosis, prognosis, HEP,  and POC. Person educated: Patient Education method: Explanation, Demonstration, Tactile cues, Verbal cues, and Handouts Education comprehension: verbalized understanding and returned demonstration     HOME EXERCISE PROGRAM: Access Code: [email or web address] URL: [email or web address] Date: 01/03/2022 Prepared by: Rudi Heap   Exercises - Seated Scapular Retraction  - 2 x daily - 7 x weekly - 3 sets - 10 reps - 5 hold - Cat Cow  - 2  x daily - 7 x weekly - 3 sets - 10 reps - 5 hold - Quadruped Full Range Thoracic Rotation with Reach  - 2 x daily - 7 x weekly - 3 sets - 10 reps - 5 hold - Seated Upper Trapezius Stretch  - 1 x daily - 7 x weekly - 2 sets - 2 reps - 30 hold   ASSESSMENT:   CLINICAL IMPRESSION: No change in  pain levels to note.  Today's session focused on aerobic work, postural correction, thoracic mobility.  Added additional tasks to promote mobility and strength which were tolerated w/o discomfort only fatigue   OBJECTIVE IMPAIRMENTS decreased activity tolerance, decreased endurance, decreased strength, impaired UE functional use, and pain.    ACTIVITY LIMITATIONS carrying, lifting, bending, sleeping, and reach over head   PARTICIPATION LIMITATIONS: cleaning, laundry, and occupation   PERSONAL FACTORS 3+ comorbidities: Anxiety, ADD, Bipolar disorder  are also affecting patient's functional outcome.    REHAB POTENTIAL: Good   CLINICAL DECISION MAKING: Stable/uncomplicated   EVALUATION COMPLEXITY: Low     GOALS: Goals reviewed with patient? No   SHORT TERM GOALS: Target date: 01/24/2022     Pt will be I and compliant with initial HEP. Baseline: provided at eval Goal status: INITIAL 2. Pt will be able to self correct posture with minimal VC.             Baseline:  Goal Status: INITIAL      LONG TERM GOALS: Target date: 02/14/2022     Pt will be independent with advanced HEP to continue to address postural limitations and muscle imbalances. Baseline: not provided Goal status: INITIAL Pt will report < 2/10 pain with OH reaching.  Baseline:  Goal status: INITIAL   2.  Pt will be able to sleep 6 consecutive hours without waking up due to pain.  Baseline: 2 hours  Goal status: INITIAL   3.  Pt will be able to work a full 8 hour shift without having to readjust.  Baseline:  Goal status: INITIAL   PLAN: PT FREQUENCY: 1x/week   PT DURATION: 6 weeks   PLANNED INTERVENTIONS: Therapeutic  exercises, Therapeutic activity, Neuromuscular re-education, Balance training, Gait training, Patient/Family education, Joint manipulation, Joint mobilization, Dry Needling, Electrical stimulation, Spinal manipulation, Spinal mobilization, Cryotherapy, Moist heat, Taping, Vasopneumatic device, Traction, Ultrasound, and Manual therapy   PLAN FOR NEXT SESSION: Assess HEP/update PRN, continue to progress functional mobility, strengthen periscapular muscles. Decrease patients pain and help minimize pain with OH lifting, carrying.    Lanice Shirts, PT 01/31/2022, 2:00 PM

## 2022-02-04 ENCOUNTER — Encounter: Payer: Self-pay | Admitting: Nurse Practitioner

## 2022-02-04 DIAGNOSIS — F4321 Adjustment disorder with depressed mood: Secondary | ICD-10-CM

## 2022-02-04 DIAGNOSIS — F419 Anxiety disorder, unspecified: Secondary | ICD-10-CM

## 2022-02-05 DIAGNOSIS — Z630 Problems in relationship with spouse or partner: Secondary | ICD-10-CM | POA: Diagnosis not present

## 2022-02-05 DIAGNOSIS — F4310 Post-traumatic stress disorder, unspecified: Secondary | ICD-10-CM | POA: Diagnosis not present

## 2022-02-05 DIAGNOSIS — F3130 Bipolar disorder, current episode depressed, mild or moderate severity, unspecified: Secondary | ICD-10-CM | POA: Diagnosis not present

## 2022-02-05 NOTE — Therapy (Unsigned)
OUTPATIENT PHYSICAL THERAPY TREATMENT NOTE   Patient Name: Jailine Lieder MRN: [id number] DOB:01-Dec-1975, 46 y.o., female Today's Date: 02/05/2022  PCP: Venita Lick, NP REFERRING PROVIDER: Lennice Sites, MD  END OF SESSION:      Past Medical History:  Diagnosis Date   ADD (attention deficit disorder)    Anxiety    Bipolar disorder ([place])    COVID-19 04/2019   Endometriosis    Past Surgical History:  Procedure Laterality Date   ABDOMINAL HYSTERECTOMY     APPENDECTOMY     COLONOSCOPY WITH PROPOFOL N/A 07/25/2020   Procedure: COLONOSCOPY WITH PROPOFOL;  Surgeon: Virgel Manifold, MD;  Location: [place];  Service: Endoscopy;  Laterality: N/A;   EYE SURGERY     LAPAROSCOPIC VAGINAL HYSTERECTOMY WITH SALPINGECTOMY Bilateral 11/10/2020   Procedure: LAPAROSCOPIC ASSISTED VAGINAL HYSTERECTOMY WITH SALPINGECTOMY;  Surgeon: Schermerhorn, Gwen Her, MD;  Location: [facility];  Service: Gynecology;  Laterality: Bilateral;   LAPAROSCOPY     Endometriosis   TONSILLECTOMY     WISDOM TOOTH EXTRACTION     Patient Active Problem List   Diagnosis Date Noted   Feeling grief 11/09/2021   Anxiety 11/09/2021   Controlled substance agreement signed 11/09/2021   History of total hysterectomy with bilateral salpingo-oophorectomy (BSO) 11/04/2021   AVM (arteriovenous malformation) of colon    Vitamin D deficiency 01/26/2019   Atopic dermatitis 01/22/2019   ADHD 01/22/2019    REFERRING DIAG:  N62 (ICD-10-CM) - Breast hypertrophy  M54.6,G89.29 (ICD-10-CM) - Chronic bilateral thoracic back pain    THERAPY DIAG: Thoracic pain   Rationale for Evaluation and Treatment Rehabilitation  PERTINENT HISTORY: The patient is a 46 y.o. female with a history of mammary hyperplasia for several years.  She has extremely large breasts causing symptoms that include the following: Back pain in the upper and lower back, including neck pain. She pulls or pins her bra straps to provide  better lift and relief of the pressure and pain. She notices relief by holding her breast up manually.  Her shoulder straps cause grooves and pain and pressure that requires padding for relief. Pain medication is sometimes required with motrin and tylenol.  Activities that are hindered by enlarged breasts include: exercise and running.  She has tried supportive clothing as well as fitted bras without improvement.     Mammogram history: 2 years ago.  Family history of breast cancer:  yes.  Tobacco use:  no.   The patient expresses the desire to pursue surgical intervention  PRECAUTIONS: none  SUBJECTIVE: Continue levels of discomfort at 8/10  PAIN:  Are you having pain? Yes: NPRS scale: 9/10 Pain location: upper back Pain description: ache Aggravating factors: prolonged positions and postural  Relieving factors: change in positions   OBJECTIVE: (objective measures completed at initial evaluation unless otherwise dated)   OBJECTIVE:    DIAGNOSTIC FINDINGS:  None   COGNITION:           Overall cognitive status: Within functional limits for tasks assessed                                  SENSATION: WFL   POSTURE: Rounded posture, forward head.    UPPER EXTREMITY ROM:    Active ROM Right eval Left eval  Shoulder flexion [facility]  Shoulder extension [facility]  Shoulder abduction [facility]  Shoulder adduction [facility]  Shoulder internal rotation [facility]  Shoulder external  rotation [facility] WFL  (Blank rows = not tested)   UPPER EXTREMITY MMT:   MMT Right eval Left eval  Shoulder flexion 4- 4-  Shoulder extension      Shoulder abduction 4+ 4+  Shoulder adduction      Shoulder internal rotation 4 4  Shoulder external rotation 4 4+  Middle trapezius 4 4  Lower trapezius 4 4  (Blank rows = not tested)   JOINT MOBILITY TESTING:  Hypomobility in C6-T3    PALPATION:  Tenderness with palpation to rhomboids, traps and paraspinals. Pt also reports tenderness with grade II joint  mobs.              TODAY'S TREATMENT:  [facility] Adult PT Treatment:                                                DATE: 02/07/22 Therapeutic Exercise: UBE L1.5 3/3 Supine flexion w/bar emphasis on breathing pattern x15  Supine hor abd YTB 15x Open book 10/10 (breathing patterns) Quadruped thoracic rotation 10/10 Quadruped alt UE/LE 10/10 Supine horizontal abduction YTB 10x Supine alternating OH flexion 10/10 Quadratus lumborum stretch 30s B Prone flexion 15x Prone extension 15x Prone Ws 15x Ball wall flexion with B lift off 15x  [facility] Adult PT Treatment:                                                DATE: 01/31/22 Therapeutic Exercise: UBE L1.5 3/3 Supine flexion w/bar emphasis on breathing pattern x15  Supine hor abd YTB 15x Open book 10/10 (breathing patterns) Quadruped thoracic rotation 10/10 Quadruped alt UE/LE 10/10 Supine horizontal abduction YTB 10x Supine alternating OH flexion 10/10 Quadratus lumborum stretch 30s B Prone flexion 15x Prone extension 15x Prone Ws 15x Sitting ER YTB 15x Wall angels 15x Ball wall flexion with B lift off 15x   [facility] Adult PT Treatment:                                                DATE: 01/17/22 Therapeutic Exercise: UBE L1 3/3 Open book 10/10 Quadruped thoracic rotation 10/10 Quadruped cat/cow 10x Supine horizontal abduction YTB 10x Supine alternating OH flexion 10/10 Quadratus lumborum stretch 30s B Manual Therapy:  Mid thoracic mobilizations into extension 5x '@T6'$ -8 with increased mobility but continued pain       PATIENT EDUCATION: Education details: Educated pt on anatomy and physiology of current symptoms, diagnosis, prognosis, HEP,  and POC. Person educated: Patient Education method: Explanation, Demonstration, Tactile cues, Verbal cues, and Handouts Education comprehension: verbalized understanding and returned demonstration     HOME EXERCISE PROGRAM: Access Code: [email or web address] URL: [email or web address] Date:  01/03/2022 Prepared by: Rudi Heap   Exercises - Seated Scapular Retraction  - 2 x daily - 7 x weekly - 3 sets - 10 reps - 5 hold - Cat Cow  - 2 x daily - 7 x weekly - 3 sets - 10 reps - 5 hold - Quadruped Full Range Thoracic Rotation with Reach  - 2 x daily - 7 x weekly - 3 sets - 10 reps - 5 hold -  Seated Upper Trapezius Stretch  - 1 x daily - 7 x weekly - 2 sets - 2 reps - 30 hold   ASSESSMENT:   CLINICAL IMPRESSION: No change in pain levels to note. Session continue to focus on postural control, breathing and strengthening of parascapular musculature. Pt performs all exercises with good forum, but no change in symptoms noted.   OBJECTIVE IMPAIRMENTS decreased activity tolerance, decreased endurance, decreased strength, impaired UE functional use, and pain.    ACTIVITY LIMITATIONS carrying, lifting, bending, sleeping, and reach over head   PARTICIPATION LIMITATIONS: cleaning, laundry, and occupation   PERSONAL FACTORS 3+ comorbidities: Anxiety, ADD, Bipolar disorder  are also affecting patient's functional outcome.    REHAB POTENTIAL: Good   CLINICAL DECISION MAKING: Stable/uncomplicated   EVALUATION COMPLEXITY: Low     GOALS: Goals reviewed with patient? No   SHORT TERM GOALS: Target date: 01/24/2022     Pt will be I and compliant with initial HEP. Baseline: provided at eval Goal status: INITIAL 2. Pt will be able to self correct posture with minimal VC.             Baseline:  Goal Status: INITIAL      LONG TERM GOALS: Target date: 02/14/2022     Pt will be independent with advanced HEP to continue to address postural limitations and muscle imbalances. Baseline: not provided Goal status: INITIAL Pt will report < 2/10 pain with OH reaching.  Baseline:  Goal status: INITIAL   2.  Pt will be able to sleep 6 consecutive hours without waking up due to pain.  Baseline: 2 hours  Goal status: INITIAL   3.  Pt will be able to work a full 8 hour shift without having to  readjust.  Baseline:  Goal status: INITIAL   PLAN: PT FREQUENCY: 1x/week   PT DURATION: 6 weeks   PLANNED INTERVENTIONS: Therapeutic exercises, Therapeutic activity, Neuromuscular re-education, Balance training, Gait training, Patient/Family education, Joint manipulation, Joint mobilization, Dry Needling, Electrical stimulation, Spinal manipulation, Spinal mobilization, Cryotherapy, Moist heat, Taping, Vasopneumatic device, Traction, Ultrasound, and Manual therapy   PLAN FOR NEXT SESSION: Assess HEP/update PRN, continue to progress functional mobility, strengthen periscapular muscles. Decrease patients pain and help minimize pain with OH lifting, carrying.    Lynden Ang, PT 02/05/2022, 9:34 AM

## 2022-02-07 ENCOUNTER — Ambulatory Visit: Payer: BLUE CROSS/BLUE SHIELD | Admitting: Physical Therapy

## 2022-02-07 ENCOUNTER — Encounter: Payer: Self-pay | Admitting: Physical Therapy

## 2022-02-07 DIAGNOSIS — R293 Abnormal posture: Secondary | ICD-10-CM | POA: Diagnosis not present

## 2022-02-07 DIAGNOSIS — M6281 Muscle weakness (generalized): Secondary | ICD-10-CM | POA: Diagnosis not present

## 2022-02-07 DIAGNOSIS — R29898 Other symptoms and signs involving the musculoskeletal system: Secondary | ICD-10-CM | POA: Diagnosis not present

## 2022-02-08 ENCOUNTER — Ambulatory Visit: Payer: BLUE CROSS/BLUE SHIELD | Admitting: Nurse Practitioner

## 2022-02-08 ENCOUNTER — Encounter: Payer: Self-pay | Admitting: Nurse Practitioner

## 2022-02-08 VITALS — BP 123/86 | HR 77 | Temp 98.2°F | Wt 136.4 lb

## 2022-02-08 DIAGNOSIS — F419 Anxiety disorder, unspecified: Secondary | ICD-10-CM

## 2022-02-08 DIAGNOSIS — F4321 Adjustment disorder with depressed mood: Secondary | ICD-10-CM

## 2022-02-08 DIAGNOSIS — F902 Attention-deficit hyperactivity disorder, combined type: Secondary | ICD-10-CM | POA: Diagnosis not present

## 2022-02-08 MED ORDER — AMPHETAMINE-DEXTROAMPHETAMINE 20 MG PO TABS: 20.0000 mg | ORAL_TABLET | Freq: Two times a day (BID) | ORAL | 0 refills | Status: DC

## 2022-02-08 MED ORDER — AMPHETAMINE-DEXTROAMPHETAMINE 20 MG PO TABS
20.0000 mg | ORAL_TABLET | Freq: Two times a day (BID) | ORAL | 0 refills | Status: DC
Start: 2022-03-09 — End: 2022-06-10

## 2022-02-08 NOTE — Assessment & Plan Note (Signed)
Refer to feeling grief plan of care.

## 2022-02-08 NOTE — Patient Instructions (Signed)
Living With Attention Deficit Hyperactivity Disorder If you have been diagnosed with attention deficit hyperactivity disorder (ADHD), you may be relieved that you now know why you have felt or behaved a certain way. Still, you may feel overwhelmed about the treatment ahead. You may also wonder how to get the support you need and how to deal with the condition day-to-day. With treatment and support, you can live with ADHD and manage your symptoms. How to manage lifestyle changes Managing stress Stress is your body's reaction to life changes and events, both good and bad. To cope with the stress of an ADHD diagnosis, it may help to: Learn more about ADHD. Exercise regularly. Even a short daily walk can lower stress levels. Participate in training or education programs (including social skills training classes) that teach you to deal with symptoms.  Medicines Your health care provider may suggest certain medicines if he or she feels that they will help to improve your condition. Stimulant medicines are usually prescribed to treat ADHD, and therapy may also be prescribed. It is important to: Avoid using alcohol and other substances that may prevent your medicines from working properly (may interact). Talk with your pharmacist or health care provider about all the medicines that you take, their possible side effects, and what medicines are safe to take together. Make it your goal to take part in all treatment decisions (shared decision-making). Ask about possible side effects of medicines that your health care provider recommends, and tell him or her how you feel about having those side effects. It is best if shared decision-making with your health care provider is part of your total treatment plan. Relationships To strengthen your relationships with family members while treating your condition, consider taking part in family therapy. You might also attend self-help groups alone or with a loved one. Be  honest about how your symptoms affect your relationships. Make an effort to communicate respectfully instead of fighting, and find ways to show others that you care. Psychotherapy may be useful in helping you cope with how ADHD affects your relationships. How to recognize changes in your condition The following signs may mean that your treatment is working well and your condition is improving: Consistently being on time for appointments. Being more organized at home and work. Other people noticing improvements in your behavior. Achieving goals that you set for yourself. Thinking more clearly. The following signs may mean that your treatment is not working very well: Feeling impatience or more confusion. Missing, forgetting, or being late for appointments. An increasing sense of disorganization and messiness. More difficulty in reaching goals that you set for yourself. Loved ones becoming angry or frustrated with you. Follow these instructions at home: Take over-the-counter and prescription medicines only as told by your health care provider. Check with your health care provider before taking any new medicines. Create structure and an organized atmosphere at home. For example: Make a list of tasks, then rank them from most important to least important. Work on one task at a time until your listed tasks are done. Make a daily schedule and follow it consistently every day. Use an appointment calendar, and check it 2 or 3 times a day to keep on track. Keep it with you when you leave the house. Create spaces where you keep certain things, and always put things back in their places after you use them. Keep all follow-up visits as told by your health care provider. This is important. Where to find support Talking to others    Keep emotion out of important discussions and speak in a calm, logical way. Listen closely and patiently to your loved ones. Try to understand their point of view, and try to  avoid getting defensive. Take responsibility for the consequences of your actions. Ask that others do not take your behaviors personally. Aim to solve problems as they come up, and express your feelings instead of bottling them up. Talk openly about what you need from your loved ones and how they can support you. Consider going to family therapy sessions or having your family meet with a specialist who deals with ADHD-related behavior problems. Finances Not all insurance plans cover mental health care, so it is important to check with your insurance carrier. If paying for co-pays or counseling services is a problem, search for a local or county mental health care center. Public mental health care services may be offered there at a low cost or no cost when you are not able to see a private health care provider. If you are taking medicine for ADHD, you may be able to get the generic form, which may be less expensive than brand-name medicine. Some makers of prescription medicines also offer help to patients who cannot afford the medicines that they need. Questions to ask your health care provider: What are the risks and benefits of taking medicines? Would I benefit from therapy? How often should I follow up with a health care provider? Contact a health care provider if: You have side effects from your medicines, such as: Repeated muscle twitches, coughing, or speech outbursts. Sleep problems. Loss of appetite. Depression. New or worsening behavior problems. Dizziness. Unusually fast heartbeat. Stomach pains. Headaches. Get help right away if: You have a severe reaction to a medicine. Your behavior suddenly gets worse. Summary With treatment and support, you can live with ADHD and manage your symptoms. The medicines that are most often prescribed for ADHD are stimulants. Consider taking part in family therapy or self-help groups with family members or friends. When you talk with friends  and family about your ADHD, be patient and communicate openly. Take over-the-counter and prescription medicines only as told by your health care provider. Check with your health care provider before taking any new medicines. This information is not intended to replace advice given to you by your health care provider. Make sure you discuss any questions you have with your health care provider. Document Revised: 11/12/2019 Document Reviewed: 12/15/2019 Elsevier Patient Education  [place].

## 2022-02-08 NOTE — Progress Notes (Signed)
BP 123/86   Pulse 77   Temp 98.2 F (36.8 C) (Oral)   Wt 136 lb 6.4 oz (61.9 kg)   LMP 09/21/2020 (Approximate)   SpO2 97%   BMI 24.16 kg/m    Subjective:    Patient ID: Lisa Baird, female    DOB: Mar 12, 1976, 46 y.o.   MRN: [id number]  HPI: Lisa Baird is a 46 y.o. female  Chief Complaint  Patient presents with   ADHD    Patient is here for a three month follow up on ADHD. Patient denies having any concerns at today's visit.    ADHD FOLLOW UP Started on this by Dr. Olen Pel with psychiatry, have taken over filling at this this clinic.  Contract and UDS obtained at last visit, but educated on office protocol.  She is being followed by therapy in [place], [place] -- which she feels will be beneficial to her recent stressors.  Last refill 01/10/22. ADHD status: controlled Satisfied with current therapy: yes Medication compliance:  good compliance Controlled substance contract: yes Previous psychiatry evaluation: yes Previous medications: Adderall   Taking meds on weekends/vacations: occasionally Work/school performance:  good Difficulty sustaining attention/completing tasks: no Distracted by extraneous stimuli: no Does not listen when spoken to: no  Fidgets with hands or feet: yes Unable to stay in seat: no Blurts out/interrupts others: no ADHD Medication Side Effects: no    Decreased appetite: no    Headache: no    Sleeping disturbance pattern: no    Irritability: no    Rebound effects (worse than baseline) off medication: no    Anxiousness: no    Dizziness: no    Tics: no   Relevant past medical, surgical, family and social history reviewed and updated as indicated. Interim medical history since our last visit reviewed. Allergies and medications reviewed and updated.  Review of Systems  Constitutional:  Negative for activity change, appetite change, diaphoresis, fatigue and fever.  Respiratory:  Negative for cough, chest tightness and shortness of  breath.   Cardiovascular:  Negative for chest pain, palpitations and leg swelling.  Gastrointestinal: Negative.   Neurological: Negative.   Psychiatric/Behavioral:  Negative for decreased concentration, self-injury, sleep disturbance and suicidal ideas. The patient is not nervous/anxious.     Per HPI unless specifically indicated above     Objective:    BP 123/86   Pulse 77   Temp 98.2 F (36.8 C) (Oral)   Wt 136 lb 6.4 oz (61.9 kg)   LMP 09/21/2020 (Approximate)   SpO2 97%   BMI 24.16 kg/m   Wt Readings from Last 3 Encounters:  02/08/22 136 lb 6.4 oz (61.9 kg)  12/21/21 139 lb (63 kg)  11/09/21 138 lb 12.8 oz (63 kg)    Physical Exam Vitals and nursing note reviewed.  Constitutional:      General: She is awake. She is not in acute distress.    Appearance: She is well-developed and well-groomed. She is not ill-appearing or toxic-appearing.  HENT:     Head: Normocephalic.     Right Ear: Hearing normal.     Left Ear: Hearing normal.  Eyes:     General: Lids are normal.        Right eye: No discharge.        Left eye: No discharge.     Conjunctiva/sclera: Conjunctivae normal.     Pupils: Pupils are equal, round, and reactive to light.  Neck:     Thyroid: No thyromegaly.  Vascular: No carotid bruit.  Cardiovascular:     Rate and Rhythm: Normal rate and regular rhythm.     Heart sounds: Normal heart sounds. No murmur heard.    No gallop.  Pulmonary:     Effort: Pulmonary effort is normal. No accessory muscle usage or respiratory distress.     Breath sounds: Normal breath sounds.  Abdominal:     General: Bowel sounds are normal.     Palpations: Abdomen is soft.  Musculoskeletal:     Cervical back: Normal range of motion and neck supple.     Right lower leg: No edema.     Left lower leg: No edema.  Skin:    General: Skin is warm and dry.  Neurological:     Mental Status: She is alert and oriented to person, place, and time.  Psychiatric:        Attention and  Perception: Attention normal.        Mood and Affect: Mood normal.        Speech: Speech normal.        Behavior: Behavior normal. Behavior is cooperative.        Thought Content: Thought content normal.     Results for orders placed or performed in visit on 11/09/21  [id number] 11+Oxyco+Alc+Crt-Bund  Result Value Ref Range   Ethanol Negative Cutoff=0.020 %   Amphetamines, Urine Negative Cutoff=1000 ng/mL   Barbiturate Negative Cutoff=200 ng/mL   BENZODIAZ UR QL Negative Cutoff=200 ng/mL   Cannabinoid Quant, Ur Negative Cutoff=50 ng/mL   Cocaine (Metabolite) Negative Cutoff=300 ng/mL   OPIATE SCREEN URINE Negative Cutoff=300 ng/mL   Oxycodone/Oxymorphone, Urine Negative Cutoff=300 ng/mL   Phencyclidine Negative Cutoff=25 ng/mL   Methadone Screen, Urine Negative Cutoff=300 ng/mL   Propoxyphene Negative Cutoff=300 ng/mL   Meperidine Negative Cutoff=200 ng/mL   Tramadol Negative Cutoff=200 ng/mL   Creatinine 99.2 20.0 - 300.0 mg/dL   pH, Urine 5.9 4.5 - 8.9      Assessment & Plan:   Problem List Items Addressed This Visit       Other   ADHD - Primary    Chronic, diagnosed by Dr. Olen Pel with psychiatry in 2022.  Tolerating medication well with good symptom control, continue this.  She is agreeable to every 3 months visits and contract + UDS annually.  Obain UDS next 11/10/22 and substance agreement on file.  Refills sent x 3 for 02/08/22, 03/08/22, and 04/09/22.  Return in 3 months for further refills and labs.      Anxiety    Refer to feeling grief plan of care.      Feeling grief    Feeling grief due to loss of marriage, she is attending therapy at this time and is noticing benefit from this.  Continue therapy as scheduled.        Follow up plan: Return in about 3 months (around 05/11/2022) for Annual physical -- med refills.

## 2022-02-08 NOTE — Assessment & Plan Note (Signed)
Feeling grief due to loss of marriage, she is attending therapy at this time and is noticing benefit from this.  Continue therapy as scheduled.

## 2022-02-08 NOTE — Assessment & Plan Note (Signed)
Chronic, diagnosed by Dr. Olen Pel with psychiatry in 2022.  Tolerating medication well with good symptom control, continue this.  She is agreeable to every 3 months visits and contract + UDS annually.  Obain UDS next 11/10/22 and substance agreement on file.  Refills sent x 3 for 02/08/22, 03/08/22, and 04/09/22.  Return in 3 months for further refills and labs.

## 2022-02-12 DIAGNOSIS — F4310 Post-traumatic stress disorder, unspecified: Secondary | ICD-10-CM | POA: Diagnosis not present

## 2022-02-12 DIAGNOSIS — F3130 Bipolar disorder, current episode depressed, mild or moderate severity, unspecified: Secondary | ICD-10-CM | POA: Diagnosis not present

## 2022-02-12 DIAGNOSIS — Z630 Problems in relationship with spouse or partner: Secondary | ICD-10-CM | POA: Diagnosis not present

## 2022-02-14 ENCOUNTER — Ambulatory Visit: Payer: BLUE CROSS/BLUE SHIELD | Attending: Plastic Surgery

## 2022-02-14 DIAGNOSIS — R29898 Other symptoms and signs involving the musculoskeletal system: Secondary | ICD-10-CM | POA: Diagnosis not present

## 2022-02-14 DIAGNOSIS — R293 Abnormal posture: Secondary | ICD-10-CM | POA: Insufficient documentation

## 2022-02-14 DIAGNOSIS — M6281 Muscle weakness (generalized): Secondary | ICD-10-CM | POA: Insufficient documentation

## 2022-02-14 NOTE — Therapy (Signed)
OUTPATIENT PHYSICAL THERAPY TREATMENT NOTE   Patient Name: Lisa Baird MRN: [id number] DOB:03-11-1976, 46 y.o., female Today's Date: 02/14/2022  PCP: Venita Lick, NP REFERRING PROVIDER: Lennice Sites, MD  END OF SESSION:   PT End of Session - 02/14/22 1409     Visit Number 5    Number of Visits 7    Date for PT Re-Evaluation 02/21/22    Authorization Type BCBS    PT Start Time 1410    PT Stop Time 1440    PT Time Calculation (min) 30 min    Activity Tolerance Patient tolerated treatment well    Behavior During Therapy WFL for tasks assessed/performed               Past Medical History:  Diagnosis Date   ADD (attention deficit disorder)    Anxiety    Bipolar disorder ([place])    COVID-19 04/2019   Endometriosis    Past Surgical History:  Procedure Laterality Date   ABDOMINAL HYSTERECTOMY     APPENDECTOMY     COLONOSCOPY WITH PROPOFOL N/A 07/25/2020   Procedure: COLONOSCOPY WITH PROPOFOL;  Surgeon: Virgel Manifold, MD;  Location: [facility];  Service: Endoscopy;  Laterality: N/A;   EYE SURGERY     LAPAROSCOPIC VAGINAL HYSTERECTOMY WITH SALPINGECTOMY Bilateral 11/10/2020   Procedure: LAPAROSCOPIC ASSISTED VAGINAL HYSTERECTOMY WITH SALPINGECTOMY;  Surgeon: Schermerhorn, Gwen Her, MD;  Location: [facility];  Service: Gynecology;  Laterality: Bilateral;   LAPAROSCOPY     Endometriosis   TONSILLECTOMY     WISDOM TOOTH EXTRACTION     Patient Active Problem List   Diagnosis Date Noted   Feeling grief 11/09/2021   Anxiety 11/09/2021   Controlled substance agreement signed 11/09/2021   History of total hysterectomy with bilateral salpingo-oophorectomy (BSO) 11/04/2021   AVM (arteriovenous malformation) of colon    Vitamin D deficiency 01/26/2019   Atopic dermatitis 01/22/2019   ADHD 01/22/2019    REFERRING DIAG:  N62 (ICD-10-CM) - Breast hypertrophy  M54.6,G89.29 (ICD-10-CM) - Chronic bilateral thoracic back pain    THERAPY DIAG: Thoracic  pain   Rationale for Evaluation and Treatment Rehabilitation  PERTINENT HISTORY: The patient is a 46 y.o. female with a history of mammary hyperplasia for several years.  She has extremely large breasts causing symptoms that include the following: Back pain in the upper and lower back, including neck pain. She pulls or pins her bra straps to provide better lift and relief of the pressure and pain. She notices relief by holding her breast up manually.  Her shoulder straps cause grooves and pain and pressure that requires padding for relief. Pain medication is sometimes required with motrin and tylenol.  Activities that are hindered by enlarged breasts include: exercise and running.  She has tried supportive clothing as well as fitted bras without improvement.     Mammogram history: 2 years ago.  Family history of breast cancer:  yes.  Tobacco use:  no.   The patient expresses the desire to pursue surgical intervention  PRECAUTIONS: none  SUBJECTIVE: Continue levels of discomfort at 8/10  PAIN:  Are you having pain? Yes: NPRS scale: 9/10 Pain location: upper back Pain description: ache Aggravating factors: prolonged positions and postural  Relieving factors: change in positions   OBJECTIVE: (objective measures completed at initial evaluation unless otherwise dated)   OBJECTIVE:    DIAGNOSTIC FINDINGS:  None   COGNITION:           Overall cognitive status: Within functional limits for  tasks assessed                                  SENSATION: WFL   POSTURE: Rounded posture, forward head.    UPPER EXTREMITY ROM:    Active ROM Right eval Left eval  Shoulder flexion [facility]  Shoulder extension [facility]  Shoulder abduction [facility]  Shoulder adduction [facility]  Shoulder internal rotation [facility] WFL  Shoulder external rotation WFL WFL  (Blank rows = not tested)   UPPER EXTREMITY MMT:   MMT Right eval Left eval  Shoulder flexion 4- 4-  Shoulder extension      Shoulder  abduction 4+ 4+  Shoulder adduction      Shoulder internal rotation 4 4  Shoulder external rotation 4 4+  Middle trapezius 4 4  Lower trapezius 4 4  (Blank rows = not tested)   JOINT MOBILITY TESTING:  Hypomobility in C6-T3    PALPATION:  Tenderness with palpation to rhomboids, traps and paraspinals. Pt also reports tenderness with grade II joint mobs.              TODAY'S TREATMENT:  [facility] Adult PT Treatment:                                                DATE: 02/14/22 Therapeutic Exercise: UBE L 2 3/3 Supine flexion w/bar emphasis on breathing pattern x10 Supine hor abd RTB 15x Open book 10/10 (breathing patterns) 1#  Quadruped thoracic rotation 10/10 (hand b/h head) Quadruped alt UE/LE 10/10 Supine alternating OH flexion 10/10 1# Quadratus lumborum stretch 30s B Prone flexion 15x Prone extension 15x Prone Ws 15x Ball wall flexion with B lift off 15x   [facility] Adult PT Treatment:                                                DATE: 02/07/22 Therapeutic Exercise: UBE L1.5 3/3 Supine flexion w/bar emphasis on breathing pattern x15  Supine hor abd YTB 15x Open book 10/10 (breathing patterns) Quadruped thoracic rotation 10/10 Quadruped alt UE/LE 10/10 Supine horizontal abduction YTB 10x Supine alternating OH flexion 10/10 Quadratus lumborum stretch 30s B Prone flexion 15x Prone extension 15x Prone Ws 15x Ball wall flexion with B lift off 15x  [facility] Adult PT Treatment:                                                DATE: 01/31/22 Therapeutic Exercise: UBE L1.5 3/3 Supine flexion w/bar emphasis on breathing pattern x15  Supine hor abd YTB 15x Open book 10/10 (breathing patterns) Quadruped thoracic rotation 10/10 Quadruped alt UE/LE 10/10 Supine horizontal abduction YTB 10x Supine alternating OH flexion 10/10 Quadratus lumborum stretch 30s B Prone flexion 15x Prone extension 15x Prone Ws 15x Sitting ER YTB 15x Wall angels 15x Ball wall flexion with B lift off 15x    [facility] Adult PT Treatment:  DATE: 01/17/22 Therapeutic Exercise: UBE L1 3/3 Open book 10/10 Quadruped thoracic rotation 10/10 Quadruped cat/cow 10x Supine horizontal abduction YTB 10x Supine alternating OH flexion 10/10 Quadratus lumborum stretch 30s B Manual Therapy:  Mid thoracic mobilizations into extension 5x '@T6'$ -8 with increased mobility but continued pain       PATIENT EDUCATION: Education details: Educated pt on anatomy and physiology of current symptoms, diagnosis, prognosis, HEP,  and POC. Person educated: Patient Education method: Explanation, Demonstration, Tactile cues, Verbal cues, and Handouts Education comprehension: verbalized understanding and returned demonstration     HOME EXERCISE PROGRAM: Access Code: [email or web address] URL: [email or web address] Date: 01/03/2022 Prepared by: Rudi Heap   Exercises - Seated Scapular Retraction  - 2 x daily - 7 x weekly - 3 sets - 10 reps - 5 hold - Cat Cow  - 2 x daily - 7 x weekly - 3 sets - 10 reps - 5 hold - Quadruped Full Range Thoracic Rotation with Reach  - 2 x daily - 7 x weekly - 3 sets - 10 reps - 5 hold - Seated Upper Trapezius Stretch  - 1 x daily - 7 x weekly - 2 sets - 2 reps - 30 hold   ASSESSMENT:   CLINICAL IMPRESSION: No change in pain levels to note. Session continue to focus on postural control, breathing and strengthening of parascapular musculature. Added weight to tasks to increase challenge, continued to emphasize breathing patterns and posture.   OBJECTIVE IMPAIRMENTS decreased activity tolerance, decreased endurance, decreased strength, impaired UE functional use, and pain.    ACTIVITY LIMITATIONS carrying, lifting, bending, sleeping, and reach over head   PARTICIPATION LIMITATIONS: cleaning, laundry, and occupation   PERSONAL FACTORS 3+ comorbidities: Anxiety, ADD, Bipolar disorder  are also affecting patient's functional outcome.    REHAB  POTENTIAL: Good   CLINICAL DECISION MAKING: Stable/uncomplicated   EVALUATION COMPLEXITY: Low     GOALS: Goals reviewed with patient? No   SHORT TERM GOALS: Target date: 01/24/2022     Pt will be I and compliant with initial HEP. Baseline: provided at eval Goal status: INITIAL 2. Pt will be able to self correct posture with minimal VC.             Baseline:  Goal Status: INITIAL      LONG TERM GOALS: Target date: 02/14/2022     Pt will be independent with advanced HEP to continue to address postural limitations and muscle imbalances. Baseline: not provided Goal status: INITIAL Pt will report < 2/10 pain with OH reaching.  Baseline:  Goal status: INITIAL   2.  Pt will be able to sleep 6 consecutive hours without waking up due to pain.  Baseline: 2 hours  Goal status: INITIAL   3.  Pt will be able to work a full 8 hour shift without having to readjust.  Baseline:  Goal status: INITIAL   PLAN: PT FREQUENCY: 1x/week   PT DURATION: 6 weeks   PLANNED INTERVENTIONS: Therapeutic exercises, Therapeutic activity, Neuromuscular re-education, Balance training, Gait training, Patient/Family education, Joint manipulation, Joint mobilization, Dry Needling, Electrical stimulation, Spinal manipulation, Spinal mobilization, Cryotherapy, Moist heat, Taping, Vasopneumatic device, Traction, Ultrasound, and Manual therapy   PLAN FOR NEXT SESSION: Assess for DC   Lanice Shirts, PT 02/14/2022, 3:48 PM

## 2022-02-19 DIAGNOSIS — Z630 Problems in relationship with spouse or partner: Secondary | ICD-10-CM | POA: Diagnosis not present

## 2022-02-19 DIAGNOSIS — F4310 Post-traumatic stress disorder, unspecified: Secondary | ICD-10-CM | POA: Diagnosis not present

## 2022-02-19 DIAGNOSIS — F3130 Bipolar disorder, current episode depressed, mild or moderate severity, unspecified: Secondary | ICD-10-CM | POA: Diagnosis not present

## 2022-02-19 NOTE — Progress Notes (Signed)
   Referring Provider Venita Lick, NP [place],  [place] [address]   CC:  Chief Complaint  Patient presents with   Follow-up      Lisa Baird is an 46 y.o. female.  HPI: Patient is a 46 y.o. year old female here for follow up after completing physical therapy for pain related to macromastia.   She was seen for initial consult by Dr. Erin Hearing.  At that time, she complained of chronic upper back and neck discomfort in context of large breasts.  Preoperative bra size equals DD cup.  BMI equals 24 kg/m.  STN 32 cm each side.  Estimated excess breast tissue removed at time of surgery was 500 g each side.  Today, patient states that she completed her 6 weeks of physical therapy without any considerable improvement in her chronic upper back, neck, and shoulder discomfort.  She endorses poor posture since she was younger which she attributes to her large breasts.  She states that she has to wear 2 bras every single day and cannot run or perform other exercises due to her breast size.  She will experience intermittent inframammary rashes for which she applies baby powder.  Patient also discusses the mental impact of macromastia.  She is still very much hopeful that she can proceed with a breast reduction surgery.  Review of Systems General: Denies fevers MSK: Endorses ongoing back and neck discomfort Skin: Endorses intermittent inframammary rashes  Physical Exam    02/22/2022    1:04 PM 02/08/2022    1:50 PM 12/21/2021   11:02 AM  Vitals with BMI  Height   '5\' 3"'$   Weight 131 lbs 13 oz 136 lbs 6 oz 139 lbs  BMI   16.60  Systolic  600 459  Diastolic  86 95  Pulse  77 83    General:  No acute distress,  Alert and oriented, Non-Toxic, Normal speech and affect Psych: Normal behavior and mood Respiratory: No increased WOB MSK: Ambulatory  Assessment/Plan  Patient is interested in pursuing surgical intervention for bilateral breast reduction. Patient has completed at least 6 weeks  of physical therapy for pain related to macromastia.  Discussed with patient we would submit to insurance for authorization, discussed approval could take up to 6 weeks.   Krista Blue 02/22/2022, 1:36 PM

## 2022-02-21 ENCOUNTER — Ambulatory Visit: Payer: BLUE CROSS/BLUE SHIELD

## 2022-02-21 DIAGNOSIS — R293 Abnormal posture: Secondary | ICD-10-CM | POA: Diagnosis not present

## 2022-02-21 DIAGNOSIS — M6281 Muscle weakness (generalized): Secondary | ICD-10-CM

## 2022-02-21 DIAGNOSIS — R29898 Other symptoms and signs involving the musculoskeletal system: Secondary | ICD-10-CM

## 2022-02-21 NOTE — Therapy (Signed)
OUTPATIENT PHYSICAL THERAPY TREATMENT NOTE/DC SUMMARY   Patient Name: Lisa Baird MRN: [id number] DOB:1975-10-28, 46 y.o., female Today's Date: 02/21/2022  PCP: Venita Lick, NP REFERRING PROVIDER: Lennice Sites, MD PHYSICAL THERAPY DISCHARGE SUMMARY  Visits from Start of Care: 6  Current functional level related to goals / functional outcomes: Goals partially met   Remaining deficits: Weakness, postural dysfunction, pain   Education / Equipment: HEP   Patient agrees to discharge. Patient goals were partially met. Patient is being discharged due to being pleased with the current functional level.  END OF SESSION:   PT End of Session - 02/21/22 1748     Visit Number 6    Number of Visits 7    Date for PT Re-Evaluation 02/21/22    Authorization Type [employer]    PT Start Time 1745    PT Stop Time 1825    PT Time Calculation (min) 40 min    Activity Tolerance Patient tolerated treatment well    Behavior During Therapy WFL for tasks assessed/performed                Past Medical History:  Diagnosis Date   ADD (attention deficit disorder)    Anxiety    Bipolar disorder ([place])    COVID-19 04/2019   Endometriosis    Past Surgical History:  Procedure Laterality Date   ABDOMINAL HYSTERECTOMY     APPENDECTOMY     COLONOSCOPY WITH PROPOFOL N/A 07/25/2020   Procedure: COLONOSCOPY WITH PROPOFOL;  Surgeon: Virgel Manifold, MD;  Location: [place];  Service: Endoscopy;  Laterality: N/A;   EYE SURGERY     LAPAROSCOPIC VAGINAL HYSTERECTOMY WITH SALPINGECTOMY Bilateral 11/10/2020   Procedure: LAPAROSCOPIC ASSISTED VAGINAL HYSTERECTOMY WITH SALPINGECTOMY;  Surgeon: Schermerhorn, Gwen Her, MD;  Location: [facility];  Service: Gynecology;  Laterality: Bilateral;   LAPAROSCOPY     Endometriosis   TONSILLECTOMY     WISDOM TOOTH EXTRACTION     Patient Active Problem List   Diagnosis Date Noted   Feeling grief 11/09/2021   Anxiety 11/09/2021   Controlled  substance agreement signed 11/09/2021   History of total hysterectomy with bilateral salpingo-oophorectomy (BSO) 11/04/2021   AVM (arteriovenous malformation) of colon    Vitamin D deficiency 01/26/2019   Atopic dermatitis 01/22/2019   ADHD 01/22/2019    REFERRING DIAG:  N62 (ICD-10-CM) - Breast hypertrophy  M54.6,G89.29 (ICD-10-CM) - Chronic bilateral thoracic back pain    THERAPY DIAG: Thoracic pain   Rationale for Evaluation and Treatment Rehabilitation  PERTINENT HISTORY: The patient is a 46 y.o. female with a history of mammary hyperplasia for several years.  She has extremely large breasts causing symptoms that include the following: Back pain in the upper and lower back, including neck pain. She pulls or pins her bra straps to provide better lift and relief of the pressure and pain. She notices relief by holding her breast up manually.  Her shoulder straps cause grooves and pain and pressure that requires padding for relief. Pain medication is sometimes required with motrin and tylenol.  Activities that are hindered by enlarged breasts include: exercise and running.  She has tried supportive clothing as well as fitted bras without improvement.     Mammogram history: 2 years ago.  Family history of breast cancer:  yes.  Tobacco use:  no.   The patient expresses the desire to pursue surgical intervention  PRECAUTIONS: none  SUBJECTIVE: Continue levels of discomfort at 8/10, unchanged   PAIN:  Are you having  pain? Yes: NPRS scale: 9/10 Pain location: upper back Pain description: ache Aggravating factors: prolonged positions and postural  Relieving factors: change in positions   OBJECTIVE: (objective measures completed at initial evaluation unless otherwise dated)   OBJECTIVE:    DIAGNOSTIC FINDINGS:  None   COGNITION:           Overall cognitive status: Within functional limits for tasks assessed                                  SENSATION: WFL   POSTURE: Rounded  posture, forward head.    UPPER EXTREMITY ROM:    Active ROM Right eval Left eval  Shoulder flexion [facility]  Shoulder extension [facility]  Shoulder abduction [facility]  Shoulder adduction [facility]  Shoulder internal rotation [facility] WFL  Shoulder external rotation WFL WFL  (Blank rows = not tested)   UPPER EXTREMITY MMT:   MMT Right eval Left eval  Shoulder flexion 4- 4-  Shoulder extension      Shoulder abduction 4+ 4+  Shoulder adduction      Shoulder internal rotation 4 4  Shoulder external rotation 4 4+  Middle trapezius 4 4  Lower trapezius 4 4  (Blank rows = not tested)   JOINT MOBILITY TESTING:  Hypomobility in C6-T3    PALPATION:  Tenderness with palpation to rhomboids, traps and paraspinals. Pt also reports tenderness with grade II joint mobs.              TODAY'S TREATMENT:  [facility] Adult PT Treatment:                                                DATE: 02/21/22 Therapeutic Exercise: UBE L 1 3/3 B UT stretch 30s x2  Supine flexion w/bar emphasis on breathing pattern x10 Supine hor abd RTB 15x Open book 10/10 (breathing patterns) 1#  Quadruped thoracic rotation 10/10 (hand b/h head) Quadruped alt UE/LE 10/10 Supine alternating OH flexion 10/10 1# Quadratus lumborum stretch 30s B Prone flexion 15x Prone extension 15x Prone Ws 15x Ball wall flexion with B lift off 15x   [facility] Adult PT Treatment:                                                DATE: 02/14/22 Therapeutic Exercise: UBE L 2 3/3 Supine flexion w/bar emphasis on breathing pattern x10 Supine hor abd RTB 15x Open book 10/10 (breathing patterns) 1#  Quadruped thoracic rotation 10/10 (hand b/h head) Quadruped alt UE/LE 10/10 Supine alternating OH flexion 10/10 1# Quadratus lumborum stretch 30s B Prone flexion 15x Prone extension 15x Prone Ws 15x Ball wall flexion with B lift off 15x   [facility] Adult PT Treatment:                                                DATE: 02/07/22 Therapeutic Exercise: UBE L1.5  3/3 Supine flexion w/bar emphasis on breathing pattern x15  Supine hor abd YTB 15x Open book 10/10 (breathing patterns) Quadruped  thoracic rotation 10/10 Quadruped alt UE/LE 10/10 Supine horizontal abduction YTB 10x Supine alternating OH flexion 10/10 Quadratus lumborum stretch 30s B Prone flexion 15x Prone extension 15x Prone Ws 15x Ball wall flexion with B lift off 15x       PATIENT EDUCATION: Education details: Educated pt on anatomy and physiology of current symptoms, diagnosis, prognosis, HEP,  and POC. Person educated: Patient Education method: Explanation, Demonstration, Tactile cues, Verbal cues, and Handouts Education comprehension: verbalized understanding and returned demonstration     HOME EXERCISE PROGRAM: Access Code: [email or web address] URL: [email or web address] Date: 02/21/2022 Prepared by: Sharlynn Oliphant  Exercises - Seated Upper Trapezius Stretch  - 1 x daily - 7 x weekly - 2 sets - 2 reps - 30 hold - Seated Levator Scapulae Stretch on Wall  - 2 x daily - 7 x weekly - 2 sets - 2 reps - 30s hold - Doorway Pec Stretch at 90 Degrees Abduction  - 2 x daily - 7 x weekly - 2 sets - 2 reps - 30s hold ASSESSMENT:   CLINICAL IMPRESSION: No change in pain levels to note. Session continue to focus on postural control, breathing and strengthening of parascapular musculature. HEP reviewed and updated.    OBJECTIVE IMPAIRMENTS decreased activity tolerance, decreased endurance, decreased strength, impaired UE functional use, and pain.    ACTIVITY LIMITATIONS carrying, lifting, bending, sleeping, and reach over head   PARTICIPATION LIMITATIONS: cleaning, laundry, and occupation   PERSONAL FACTORS 3+ comorbidities: Anxiety, ADD, Bipolar disorder  are also affecting patient's functional outcome.    REHAB POTENTIAL: Good   CLINICAL DECISION MAKING: Stable/uncomplicated   EVALUATION COMPLEXITY: Low     GOALS: Goals reviewed with patient? No   SHORT TERM  GOALS: Target date: 01/24/2022     Pt will be I and compliant with initial HEP. Baseline: provided at eval Goal status: Met 2. Pt will be able to self correct posture with minimal VC.             Baseline:  Goal Status: Met     LONG TERM GOALS: Target date: 02/21/2022     Pt will be independent with advanced HEP to continue to address postural limitations and muscle imbalances. Baseline: not provided Goal status: Met  Pt will report < 2/10 pain with OH reaching.  Baseline: 8/10 Goal status: Not met   2.  Pt will be able to sleep 6 consecutive hours without waking up due to pain.  Baseline: 2 hours; 2-3 hours consecutive Goal status: Not met   3.  Pt will be able to work a full 8 hour shift without having to readjust.  Baseline:  Goal status: Not met   PLAN: PT FREQUENCY: 1x/week   PT DURATION: 6 weeks   PLANNED INTERVENTIONS: Therapeutic exercises, Therapeutic activity, Neuromuscular re-education, Balance training, Gait training, Patient/Family education, Joint manipulation, Joint mobilization, Dry Needling, Electrical stimulation, Spinal manipulation, Spinal mobilization, Cryotherapy, Moist heat, Taping, Vasopneumatic device, Traction, Ultrasound, and Manual therapy   PLAN FOR NEXT SESSION: DC to MD care   Lanice Shirts, PT 02/21/2022, 6:17 PM

## 2022-02-21 NOTE — Therapy (Deleted)
OUTPATIENT PHYSICAL THERAPY TREATMENT NOTE/DC SUMMARY   Patient Name: Lisa Baird MRN: [id number] DOB:06-Feb-1976, 46 y.o., female Today's Date: 02/21/2022  PCP: Venita Lick, NP REFERRING PROVIDER: Lennice Sites, MD PHYSICAL THERAPY DISCHARGE SUMMARY  Visits from Start of Care: 6  Current functional level related to goals / functional outcomes: Goals partially met   Remaining deficits: Weakness, postural dysfunction, pain   Education / Equipment: HEP   Patient agrees to discharge. Patient goals were partially met. Patient is being discharged due to being pleased with the current functional level.  END OF SESSION:       Past Medical History:  Diagnosis Date   ADD (attention deficit disorder)    Anxiety    Bipolar disorder ([place])    COVID-19 04/2019   Endometriosis    Past Surgical History:  Procedure Laterality Date   ABDOMINAL HYSTERECTOMY     APPENDECTOMY     COLONOSCOPY WITH PROPOFOL N/A 07/25/2020   Procedure: COLONOSCOPY WITH PROPOFOL;  Surgeon: Virgel Manifold, MD;  Location: [place];  Service: Endoscopy;  Laterality: N/A;   EYE SURGERY     LAPAROSCOPIC VAGINAL HYSTERECTOMY WITH SALPINGECTOMY Bilateral 11/10/2020   Procedure: LAPAROSCOPIC ASSISTED VAGINAL HYSTERECTOMY WITH SALPINGECTOMY;  Surgeon: Schermerhorn, Gwen Her, MD;  Location: [facility];  Service: Gynecology;  Laterality: Bilateral;   LAPAROSCOPY     Endometriosis   TONSILLECTOMY     WISDOM TOOTH EXTRACTION     Patient Active Problem List   Diagnosis Date Noted   Feeling grief 11/09/2021   Anxiety 11/09/2021   Controlled substance agreement signed 11/09/2021   History of total hysterectomy with bilateral salpingo-oophorectomy (BSO) 11/04/2021   AVM (arteriovenous malformation) of colon    Vitamin D deficiency 01/26/2019   Atopic dermatitis 01/22/2019   ADHD 01/22/2019    REFERRING DIAG:  N62 (ICD-10-CM) - Breast hypertrophy  M54.6,G89.29 (ICD-10-CM) - Chronic  bilateral thoracic back pain    THERAPY DIAG: Thoracic pain   Rationale for Evaluation and Treatment Rehabilitation  PERTINENT HISTORY: The patient is a 46 y.o. female with a history of mammary hyperplasia for several years.  She has extremely large breasts causing symptoms that include the following: Back pain in the upper and lower back, including neck pain. She pulls or pins her bra straps to provide better lift and relief of the pressure and pain. She notices relief by holding her breast up manually.  Her shoulder straps cause grooves and pain and pressure that requires padding for relief. Pain medication is sometimes required with motrin and tylenol.  Activities that are hindered by enlarged breasts include: exercise and running.  She has tried supportive clothing as well as fitted bras without improvement.     Mammogram history: 2 years ago.  Family history of breast cancer:  yes.  Tobacco use:  no.   The patient expresses the desire to pursue surgical intervention  PRECAUTIONS: none  SUBJECTIVE: Continue levels of discomfort at 8/10  PAIN:  Are you having pain? Yes: NPRS scale: 9/10 Pain location: upper back Pain description: ache Aggravating factors: prolonged positions and postural  Relieving factors: change in positions   OBJECTIVE: (objective measures completed at initial evaluation unless otherwise dated)   OBJECTIVE:    DIAGNOSTIC FINDINGS:  None   COGNITION:           Overall cognitive status: Within functional limits for tasks assessed  SENSATION: WFL   POSTURE: Rounded posture, forward head.    UPPER EXTREMITY ROM:    Active ROM Right eval Left eval  Shoulder flexion [facility]  Shoulder extension [facility]  Shoulder abduction [facility]  Shoulder adduction [facility]  Shoulder internal rotation [facility] WFL  Shoulder external rotation WFL WFL  (Blank rows = not tested)   UPPER EXTREMITY MMT:   MMT Right eval Left eval   Shoulder flexion 4- 4-  Shoulder extension      Shoulder abduction 4+ 4+  Shoulder adduction      Shoulder internal rotation 4 4  Shoulder external rotation 4 4+  Middle trapezius 4 4  Lower trapezius 4 4  (Blank rows = not tested)   JOINT MOBILITY TESTING:  Hypomobility in C6-T3    PALPATION:  Tenderness with palpation to rhomboids, traps and paraspinals. Pt also reports tenderness with grade II joint mobs.              TODAY'S TREATMENT:  [facility] Adult PT Treatment:                                                DATE: 02/14/22 Therapeutic Exercise: UBE L 2 3/3 Supine flexion w/bar emphasis on breathing pattern x10 Supine hor abd RTB 15x Open book 10/10 (breathing patterns) 1#  Quadruped thoracic rotation 10/10 (hand b/h head) Quadruped alt UE/LE 10/10 Supine alternating OH flexion 10/10 1# Quadratus lumborum stretch 30s B Prone flexion 15x Prone extension 15x Prone Ws 15x Ball wall flexion with B lift off 15x   [facility] Adult PT Treatment:                                                DATE: 02/07/22 Therapeutic Exercise: UBE L1.5 3/3 Supine flexion w/bar emphasis on breathing pattern x15  Supine hor abd YTB 15x Open book 10/10 (breathing patterns) Quadruped thoracic rotation 10/10 Quadruped alt UE/LE 10/10 Supine horizontal abduction YTB 10x Supine alternating OH flexion 10/10 Quadratus lumborum stretch 30s B Prone flexion 15x Prone extension 15x Prone Ws 15x Ball wall flexion with B lift off 15x       PATIENT EDUCATION: Education details: Educated pt on anatomy and physiology of current symptoms, diagnosis, prognosis, HEP,  and POC. Person educated: Patient Education method: Explanation, Demonstration, Tactile cues, Verbal cues, and Handouts Education comprehension: verbalized understanding and returned demonstration     HOME EXERCISE PROGRAM: Access Code: [email or web address] URL: [email or web address] Date: 01/03/2022 Prepared by: Rudi Heap    Exercises - Seated Scapular Retraction  - 2 x daily - 7 x weekly - 3 sets - 10 reps - 5 hold - Cat Cow  - 2 x daily - 7 x weekly - 3 sets - 10 reps - 5 hold - Quadruped Full Range Thoracic Rotation with Reach  - 2 x daily - 7 x weekly - 3 sets - 10 reps - 5 hold - Seated Upper Trapezius Stretch  - 1 x daily - 7 x weekly - 2 sets - 2 reps - 30 hold   ASSESSMENT:   CLINICAL IMPRESSION: No change in pain levels to note. Session continue to focus on postural control, breathing and strengthening of parascapular musculature. Added weight to  tasks to increase challenge, continued to emphasize breathing patterns and posture.   OBJECTIVE IMPAIRMENTS decreased activity tolerance, decreased endurance, decreased strength, impaired UE functional use, and pain.    ACTIVITY LIMITATIONS carrying, lifting, bending, sleeping, and reach over head   PARTICIPATION LIMITATIONS: cleaning, laundry, and occupation   PERSONAL FACTORS 3+ comorbidities: Anxiety, ADD, Bipolar disorder  are also affecting patient's functional outcome.    REHAB POTENTIAL: Good   CLINICAL DECISION MAKING: Stable/uncomplicated   EVALUATION COMPLEXITY: Low     GOALS: Goals reviewed with patient? No   SHORT TERM GOALS: Target date: 01/24/2022     Pt will be I and compliant with initial HEP. Baseline: provided at eval Goal status: INITIAL 2. Pt will be able to self correct posture with minimal VC.             Baseline:  Goal Status: INITIAL      LONG TERM GOALS: Target date: 02/14/2022     Pt will be independent with advanced HEP to continue to address postural limitations and muscle imbalances. Baseline: not provided Goal status: INITIAL Pt will report < 2/10 pain with OH reaching.  Baseline:  Goal status: INITIAL   2.  Pt will be able to sleep 6 consecutive hours without waking up due to pain.  Baseline: 2 hours  Goal status: INITIAL   3.  Pt will be able to work a full 8 hour shift without having to readjust.   Baseline:  Goal status: INITIAL   PLAN: PT FREQUENCY: 1x/week   PT DURATION: 6 weeks   PLANNED INTERVENTIONS: Therapeutic exercises, Therapeutic activity, Neuromuscular re-education, Balance training, Gait training, Patient/Family education, Joint manipulation, Joint mobilization, Dry Needling, Electrical stimulation, Spinal manipulation, Spinal mobilization, Cryotherapy, Moist heat, Taping, Vasopneumatic device, Traction, Ultrasound, and Manual therapy   PLAN FOR NEXT SESSION: DC to MD care   Lanice Shirts, PT 02/21/2022, 2:23 PM

## 2022-02-22 ENCOUNTER — Ambulatory Visit (INDEPENDENT_AMBULATORY_CARE_PROVIDER_SITE_OTHER): Payer: BLUE CROSS/BLUE SHIELD | Admitting: Physician Assistant

## 2022-02-22 ENCOUNTER — Ambulatory Visit
Admission: RE | Admit: 2022-02-22 | Discharge: 2022-02-22 | Disposition: A | Discharge status: Home / Self Care | Payer: BLUE CROSS/BLUE SHIELD | Source: Ambulatory Visit | Attending: Nurse Practitioner | Admitting: Nurse Practitioner

## 2022-02-22 VITALS — Wt 131.8 lb

## 2022-02-22 DIAGNOSIS — N62 Hypertrophy of breast: Secondary | ICD-10-CM

## 2022-02-22 DIAGNOSIS — R21 Rash and other nonspecific skin eruption: Secondary | ICD-10-CM | POA: Diagnosis not present

## 2022-02-22 DIAGNOSIS — Z6824 Body mass index (BMI) 24.0-24.9, adult: Secondary | ICD-10-CM

## 2022-02-22 DIAGNOSIS — Z1231 Encounter for screening mammogram for malignant neoplasm of breast: Secondary | ICD-10-CM

## 2022-02-22 DIAGNOSIS — M546 Pain in thoracic spine: Secondary | ICD-10-CM

## 2022-02-22 DIAGNOSIS — M542 Cervicalgia: Secondary | ICD-10-CM

## 2022-02-25 ENCOUNTER — Encounter: Payer: Self-pay | Admitting: Nurse Practitioner

## 2022-02-25 ENCOUNTER — Other Ambulatory Visit: Payer: Self-pay | Admitting: Nurse Practitioner

## 2022-02-25 DIAGNOSIS — R928 Other abnormal and inconclusive findings on diagnostic imaging of breast: Secondary | ICD-10-CM

## 2022-02-25 NOTE — Progress Notes (Signed)
Contacted via [place] [facility] [place], your mammogram did return showing some abnormalities on left breast, some distortion -- this means they need to repeat mammogram and obtain ultrasound.  If you have any questions about this let me know.  I go through this every year:) I have dense tissue:)  Have a wonderful day.

## 2022-02-26 DIAGNOSIS — F3130 Bipolar disorder, current episode depressed, mild or moderate severity, unspecified: Secondary | ICD-10-CM | POA: Diagnosis not present

## 2022-02-26 DIAGNOSIS — Z630 Problems in relationship with spouse or partner: Secondary | ICD-10-CM | POA: Diagnosis not present

## 2022-02-26 DIAGNOSIS — F4310 Post-traumatic stress disorder, unspecified: Secondary | ICD-10-CM | POA: Diagnosis not present

## 2022-02-27 ENCOUNTER — Telehealth: Payer: Self-pay | Admitting: *Deleted

## 2022-02-27 NOTE — Telephone Encounter (Signed)
PA submitted to [facility] via [place]  Pending PA - [phone]

## 2022-02-28 ENCOUNTER — Ambulatory Visit
Admission: RE | Admit: 2022-02-28 | Discharge: 2022-02-28 | Disposition: A | Discharge status: Home / Self Care | Payer: BLUE CROSS/BLUE SHIELD | Source: Ambulatory Visit | Attending: Nurse Practitioner | Admitting: Nurse Practitioner

## 2022-02-28 DIAGNOSIS — R928 Other abnormal and inconclusive findings on diagnostic imaging of breast: Secondary | ICD-10-CM | POA: Insufficient documentation

## 2022-02-28 DIAGNOSIS — R922 Inconclusive mammogram: Secondary | ICD-10-CM | POA: Diagnosis not present

## 2022-02-28 NOTE — Progress Notes (Signed)
Contacted via MyChart   Repeat mammogram is nice and normal.  Great news!!  Repeat in one year:)

## 2022-03-04 ENCOUNTER — Telehealth: Payer: Self-pay | Admitting: *Deleted

## 2022-03-04 NOTE — Telephone Encounter (Signed)
LVM to schedule surgery  [employer] Auth: [id number] valid 8/23 - 11/21 2023

## 2022-03-08 ENCOUNTER — Encounter (HOSPITAL_BASED_OUTPATIENT_CLINIC_OR_DEPARTMENT_OTHER): Payer: Self-pay | Admitting: Plastic Surgery

## 2022-03-08 ENCOUNTER — Other Ambulatory Visit: Payer: Self-pay

## 2022-03-14 ENCOUNTER — Ambulatory Visit (INDEPENDENT_AMBULATORY_CARE_PROVIDER_SITE_OTHER): Payer: BLUE CROSS/BLUE SHIELD | Admitting: Surgical

## 2022-03-14 ENCOUNTER — Encounter: Payer: Self-pay | Admitting: Surgical

## 2022-03-14 VITALS — BP 145/96 | HR 76 | Ht 63.0 in | Wt 130.6 lb

## 2022-03-14 DIAGNOSIS — N62 Hypertrophy of breast: Secondary | ICD-10-CM

## 2022-03-14 DIAGNOSIS — G8929 Other chronic pain: Secondary | ICD-10-CM

## 2022-03-14 MED ORDER — HYDROCODONE-ACETAMINOPHEN 5-325 MG PO TABS: 1.0000 | ORAL_TABLET | Freq: Four times a day (QID) | ORAL | 0 refills | Status: AC | PRN

## 2022-03-14 MED ORDER — ONDANSETRON HCL 4 MG PO TABS: 4.0000 mg | ORAL_TABLET | Freq: Three times a day (TID) | ORAL | 0 refills | Status: AC | PRN

## 2022-03-14 NOTE — H&P (View-Only) (Signed)
Patient ID: Lisa Baird, female    DOB: 1976/03/27, 46 y.o.   MRN: [id number]  Chief Complaint  Patient presents with   Pre-op Exam      ICD-10-CM   1. Breast hypertrophy  N62     2. Chronic bilateral thoracic back pain  M54.6    G89.29       History of Present Illness: Lisa Baird is a 46 y.o.  female  with a history of macromastia.  She presents for preoperative evaluation for upcoming procedure, Bilateral Breast Reduction, scheduled for 03/19/2022 with Dr.  Erin Hearing  Reports history of waking up in the middle of a procedure from anesthesia in the past, no other anesthetic complications. No history of DVT/PE. No family or personal history of bleeding or clotting disorders.  Patient is not currently taking any blood thinners.  No history of CVA/MI.  Father with history of DVT shortly after COVID-vaccine, also reports complex medical history.  Summary of Previous Visit: Family history of breast cancer, no tobacco use.  Preoperative bra size equals double D cup.  Estimated excess breast tissue to be removed at time of surgery: 500 grams  Job: [employer]  PMH Significant for: Macromastia  Patient did recently have mammogram on 02/22/2022 and then subsequent diagnostic mammogram of left breast which was negative.   Past Medical History: Allergies: Allergies  Allergen Reactions   Tegretol [Carbamazepine] Swelling and Rash   Lactose Intolerance (Gi)     GI Distress    Current Medications:  Current Outpatient Medications:    [START ON 04/08/2022] amphetamine-dextroamphetamine (ADDERALL) 20 MG tablet, Take 1 tablet (20 mg total) by mouth 2 (two) times daily., Disp: 60 tablet, Rfl: 0   amphetamine-dextroamphetamine (ADDERALL) 20 MG tablet, Take 1 tablet (20 mg total) by mouth 2 (two) times daily., Disp: 60 tablet, Rfl: 0   amphetamine-dextroamphetamine (ADDERALL) 20 MG tablet, Take 1 tablet (20 mg total) by mouth 2 (two) times daily., Disp: 60 tablet, Rfl: 0    HYDROcodone-acetaminophen (NORCO) 5-325 MG tablet, Take 1 tablet by mouth every 6 (six) hours as needed for up to 5 days for severe pain., Disp: 20 tablet, Rfl: 0   ondansetron (ZOFRAN) 4 MG tablet, Take 1 tablet (4 mg total) by mouth every 8 (eight) hours as needed for nausea or vomiting., Disp: 20 tablet, Rfl: 0  Past Medical Problems: Past Medical History:  Diagnosis Date   ADD (attention deficit disorder)    Anxiety    Bipolar disorder ([place])    COVID-19 04/2019   Endometriosis     Past Surgical History: Past Surgical History:  Procedure Laterality Date   ABDOMINAL HYSTERECTOMY     APPENDECTOMY     COLONOSCOPY WITH PROPOFOL N/A 07/25/2020   Procedure: COLONOSCOPY WITH PROPOFOL;  Surgeon: Virgel Manifold, MD;  Location: [place];  Service: Endoscopy;  Laterality: N/A;   EYE SURGERY     LAPAROSCOPIC VAGINAL HYSTERECTOMY WITH SALPINGECTOMY Bilateral 11/10/2020   Procedure: LAPAROSCOPIC ASSISTED VAGINAL HYSTERECTOMY WITH SALPINGECTOMY;  Surgeon: Schermerhorn, Gwen Her, MD;  Location: [facility];  Service: Gynecology;  Laterality: Bilateral;   LAPAROSCOPY     Endometriosis   TONSILLECTOMY     WISDOM TOOTH EXTRACTION      Social History: Social History   Socioeconomic History   Marital status: Divorced    Spouse name: Harrell Gave   Number of children: Not on file   Years of education: Not on file   Highest education level: Not on file  Occupational History   Occupation: Psychiatric nurse: [employer]  Tobacco Use   Smoking status: Former    Types: Cigarettes    Quit date: 07/2017    Years since quitting: 4.6   Smokeless tobacco: Never  Vaping Use   Vaping Use: Former  Substance and Sexual Activity   Alcohol use: Yes    Comment: Socially   Drug use: Never    Comment: Marijuana as a teenager   Sexual activity: Not Currently    Partners: Male    Birth control/protection: Surgical  Other Topics Concern   Not on file  Social History Narrative   Not on file    Social Determinants of Health   Financial Resource Strain: Low Risk  (01/22/2019)   Overall Financial Resource Strain (CARDIA)    Difficulty of Paying Living Expenses: Not hard at all  Food Insecurity: No Food Insecurity (01/22/2019)   Hunger Vital Sign    Worried About Running Out of Food in the Last Year: Never true    [place] in the Last Year: Never true  Transportation Needs: No Transportation Needs (01/22/2019)   PRAPARE - Hydrologist (Medical): No    Lack of Transportation (Non-Medical): No  Physical Activity: Insufficiently Active (01/22/2019)   Exercise Vital Sign    Days of Exercise per Week: 3 days    Minutes of Exercise per Session: 30 min  Stress: No Stress Concern Present (01/22/2019)   [place]    Feeling of Stress : Only a little  Social Connections: Unknown (01/22/2019)   Social Connection and Isolation Panel [NHANES]    Frequency of Communication with Friends and Family: More than three times a week    Frequency of Social Gatherings with Friends and Family: More than three times a week    Attends Religious Services: Never    Marine scientist or Organizations: No    Attends Archivist Meetings: Never    Marital Status: Not on file  Intimate Partner Violence: Not At Risk (01/22/2019)   Humiliation, Afraid, Rape, and Kick questionnaire    Fear of Current or Ex-Partner: No    Emotionally Abused: No    Physically Abused: No    Sexually Abused: No    Family History: Family History  Problem Relation Age of Onset   Emphysema Mother    Prostate cancer Father    Hyperlipidemia Father    Hypertension Father    Breast cancer Maternal Aunt 64   Diabetes Paternal Uncle    Brain cancer Paternal Uncle    Hypertension Maternal Grandmother    Heart disease Maternal Grandmother     Review of Systems: Review of Systems  Constitutional: Negative.    Respiratory: Negative.    Cardiovascular: Negative.   Gastrointestinal: Negative.     Physical Exam: Vital Signs BP (!) 145/96 (BP Location: Left Arm, Patient Position: Sitting, Cuff Size: Small)   Pulse 76   Ht '5\' 3"'$  (1.6 m)   Wt 130 lb 9.6 oz (59.2 kg)   LMP 09/21/2020 (Approximate)   SpO2 100%   BMI 23.13 kg/m   Physical Exam  Constitutional:      General: Not in acute distress.    Appearance: Normal appearance. Not ill-appearing.  HENT:     Head: Normocephalic and atraumatic.  Eyes:     Pupils: Pupils are equal, round Neck:     Musculoskeletal: Normal range of  motion.  Cardiovascular:     Rate and Rhythm: Normal rate    Pulses: Normal pulses.  Pulmonary:     Effort: Pulmonary effort is normal. No respiratory distress.  Musculoskeletal: Normal range of motion.  Skin:    General: Skin is warm and dry.     Findings: No erythema or rash.  Neurological:     General: No focal deficit present.     Mental Status: Alert and oriented to person, place, and time. Mental status is at baseline.     Motor: No weakness.  Psychiatric:        Mood and Affect: Mood normal.        Behavior: Behavior normal.    Assessment/Plan: The patient is scheduled for bilateral breast reduction with Dr. Erin Hearing.  Risks, benefits, and alternatives of procedure discussed, questions answered and consent obtained.    Smoking Status: Former smoker; Counseling Given?  N/A Last Mammogram: 02/22/2022 and 02/28/2022; Results: Negative  Caprini Score: 6, high; Risk Factors include: Age, father with history of DVT, and length of planned surgery. Recommendation for mechanical prophylaxis. Encourage early ambulation.  I do feel as if patient is low/moderate risk for DVT complications when you consider her father's DVT was provoked by multiple medical comorbidities and the COVID-19 vaccination.  Pictures obtained: '@consult'$   Post-op Rx sent to pharmacy: Oxycodone, Zofran  Patient was provided with the  breast reduction and General Surgical Risk consent document and Pain Medication Agreement prior to their appointment.  They had adequate time to read through the risk consent documents and Pain Medication Agreement. We also discussed them in person together during this preop appointment. All of their questions were answered to their satisfaction.  Recommended calling if they have any further questions.  Risk consent form and Pain Medication Agreement to be scanned into patient's chart.  The risk that can be encountered with breast reduction were discussed and include the following but not limited to these:  Breast asymmetry, fluid accumulation, firmness of the breast, inability to breast feed, loss of nipple or areola, skin loss, decrease or no nipple sensation, fat necrosis of the breast tissue, bleeding, infection, healing delay.  There are risks of anesthesia, changes to skin sensation and injury to nerves or blood vessels.  The muscle can be temporarily or permanently injured.  You may have an allergic reaction to tape, suture, glue, blood products which can result in skin discoloration, swelling, pain, skin lesions, poor healing.  Any of these can lead to the need for revisonal surgery or stage procedures.  A reduction has potential to interfere with diagnostic procedures.  Nipple or breast piercing can increase risks of infection.  This procedure is best done when the breast is fully developed.  Changes in the breast will continue to occur over time.  Pregnancy can alter the outcomes of previous breast reduction surgery, weight gain and weigh loss can also effect the long term appearance.   Recommend holding Adderall day of procedure.  Electronically signed by: Carola Rhine Andi Mahaffy, PA-C 03/14/2022 10:04 AM

## 2022-03-14 NOTE — Progress Notes (Signed)
Patient ID: Lisa Baird, female    DOB: 1975-07-20, 46 y.o.   MRN: [id number]  Chief Complaint  Patient presents with   Pre-op Exam      ICD-10-CM   1. Breast hypertrophy  N62     2. Chronic bilateral thoracic back pain  M54.6    G89.29       History of Present Illness: Lisa Baird is a 46 y.o.  female  with a history of macromastia.  She presents for preoperative evaluation for upcoming procedure, Bilateral Breast Reduction, scheduled for 03/19/2022 with Dr.  Erin Hearing  Reports history of waking up in the middle of a procedure from anesthesia in the past, no other anesthetic complications. No history of DVT/PE. No family or personal history of bleeding or clotting disorders.  Patient is not currently taking any blood thinners.  No history of CVA/MI.  Father with history of DVT shortly after COVID-vaccine, also reports complex medical history.  Summary of Previous Visit: Family history of breast cancer, no tobacco use.  Preoperative bra size equals double D cup.  Estimated excess breast tissue to be removed at time of surgery: 500 grams  Job: [employer]  PMH Significant for: Macromastia  Patient did recently have mammogram on 02/22/2022 and then subsequent diagnostic mammogram of left breast which was negative.   Past Medical History: Allergies: Allergies  Allergen Reactions   Tegretol [Carbamazepine] Swelling and Rash   Lactose Intolerance (Gi)     GI Distress    Current Medications:  Current Outpatient Medications:    [START ON 04/08/2022] amphetamine-dextroamphetamine (ADDERALL) 20 MG tablet, Take 1 tablet (20 mg total) by mouth 2 (two) times daily., Disp: 60 tablet, Rfl: 0   amphetamine-dextroamphetamine (ADDERALL) 20 MG tablet, Take 1 tablet (20 mg total) by mouth 2 (two) times daily., Disp: 60 tablet, Rfl: 0   amphetamine-dextroamphetamine (ADDERALL) 20 MG tablet, Take 1 tablet (20 mg total) by mouth 2 (two) times daily., Disp: 60 tablet, Rfl: 0    HYDROcodone-acetaminophen (NORCO) 5-325 MG tablet, Take 1 tablet by mouth every 6 (six) hours as needed for up to 5 days for severe pain., Disp: 20 tablet, Rfl: 0   ondansetron (ZOFRAN) 4 MG tablet, Take 1 tablet (4 mg total) by mouth every 8 (eight) hours as needed for nausea or vomiting., Disp: 20 tablet, Rfl: 0  Past Medical Problems: Past Medical History:  Diagnosis Date   ADD (attention deficit disorder)    Anxiety    Bipolar disorder ([place])    COVID-19 04/2019   Endometriosis     Past Surgical History: Past Surgical History:  Procedure Laterality Date   ABDOMINAL HYSTERECTOMY     APPENDECTOMY     COLONOSCOPY WITH PROPOFOL N/A 07/25/2020   Procedure: COLONOSCOPY WITH PROPOFOL;  Surgeon: Virgel Manifold, MD;  Location: [place];  Service: Endoscopy;  Laterality: N/A;   EYE SURGERY     LAPAROSCOPIC VAGINAL HYSTERECTOMY WITH SALPINGECTOMY Bilateral 11/10/2020   Procedure: LAPAROSCOPIC ASSISTED VAGINAL HYSTERECTOMY WITH SALPINGECTOMY;  Surgeon: Schermerhorn, Gwen Her, MD;  Location: [facility];  Service: Gynecology;  Laterality: Bilateral;   LAPAROSCOPY     Endometriosis   TONSILLECTOMY     WISDOM TOOTH EXTRACTION      Social History: Social History   Socioeconomic History   Marital status: Divorced    Spouse name: Harrell Gave   Number of children: Not on file   Years of education: Not on file   Highest education level: Not on file  Occupational History   Occupation: Psychiatric nurse: [employer]  Tobacco Use   Smoking status: Former    Types: Cigarettes    Quit date: 07/2017    Years since quitting: 4.6   Smokeless tobacco: Never  Vaping Use   Vaping Use: Former  Substance and Sexual Activity   Alcohol use: Yes    Comment: Socially   Drug use: Never    Comment: Marijuana as a teenager   Sexual activity: Not Currently    Partners: Male    Birth control/protection: Surgical  Other Topics Concern   Not on file  Social History Narrative   Not on file    Social Determinants of Health   Financial Resource Strain: Low Risk  (01/22/2019)   Overall Financial Resource Strain (CARDIA)    Difficulty of Paying Living Expenses: Not hard at all  Food Insecurity: No Food Insecurity (01/22/2019)   Hunger Vital Sign    Worried About Running Out of Food in the Last Year: Never true    [place] in the Last Year: Never true  Transportation Needs: No Transportation Needs (01/22/2019)   PRAPARE - Hydrologist (Medical): No    Lack of Transportation (Non-Medical): No  Physical Activity: Insufficiently Active (01/22/2019)   Exercise Vital Sign    Days of Exercise per Week: 3 days    Minutes of Exercise per Session: 30 min  Stress: No Stress Concern Present (01/22/2019)   [place]    Feeling of Stress : Only a little  Social Connections: Unknown (01/22/2019)   Social Connection and Isolation Panel [NHANES]    Frequency of Communication with Friends and Family: More than three times a week    Frequency of Social Gatherings with Friends and Family: More than three times a week    Attends Religious Services: Never    Marine scientist or Organizations: No    Attends Archivist Meetings: Never    Marital Status: Not on file  Intimate Partner Violence: Not At Risk (01/22/2019)   Humiliation, Afraid, Rape, and Kick questionnaire    Fear of Current or Ex-Partner: No    Emotionally Abused: No    Physically Abused: No    Sexually Abused: No    Family History: Family History  Problem Relation Age of Onset   Emphysema Mother    Prostate cancer Father    Hyperlipidemia Father    Hypertension Father    Breast cancer Maternal Aunt 73   Diabetes Paternal Uncle    Brain cancer Paternal Uncle    Hypertension Maternal Grandmother    Heart disease Maternal Grandmother     Review of Systems: Review of Systems  Constitutional: Negative.    Respiratory: Negative.    Cardiovascular: Negative.   Gastrointestinal: Negative.     Physical Exam: Vital Signs BP (!) 145/96 (BP Location: Left Arm, Patient Position: Sitting, Cuff Size: Small)   Pulse 76   Ht '5\' 3"'$  (1.6 m)   Wt 130 lb 9.6 oz (59.2 kg)   LMP 09/21/2020 (Approximate)   SpO2 100%   BMI 23.13 kg/m   Physical Exam  Constitutional:      General: Not in acute distress.    Appearance: Normal appearance. Not ill-appearing.  HENT:     Head: Normocephalic and atraumatic.  Eyes:     Pupils: Pupils are equal, round Neck:     Musculoskeletal: Normal range of  motion.  Cardiovascular:     Rate and Rhythm: Normal rate    Pulses: Normal pulses.  Pulmonary:     Effort: Pulmonary effort is normal. No respiratory distress.  Musculoskeletal: Normal range of motion.  Skin:    General: Skin is warm and dry.     Findings: No erythema or rash.  Neurological:     General: No focal deficit present.     Mental Status: Alert and oriented to person, place, and time. Mental status is at baseline.     Motor: No weakness.  Psychiatric:        Mood and Affect: Mood normal.        Behavior: Behavior normal.    Assessment/Plan: The patient is scheduled for bilateral breast reduction with Dr. Erin Hearing.  Risks, benefits, and alternatives of procedure discussed, questions answered and consent obtained.    Smoking Status: Former smoker; Counseling Given?  N/A Last Mammogram: 02/22/2022 and 02/28/2022; Results: Negative  Caprini Score: 6, high; Risk Factors include: Age, father with history of DVT, and length of planned surgery. Recommendation for mechanical prophylaxis. Encourage early ambulation.  I do feel as if patient is low/moderate risk for DVT complications when you consider her father's DVT was provoked by multiple medical comorbidities and the COVID-19 vaccination.  Pictures obtained: '@consult'$   Post-op Rx sent to pharmacy: Oxycodone, Zofran  Patient was provided with the  breast reduction and General Surgical Risk consent document and Pain Medication Agreement prior to their appointment.  They had adequate time to read through the risk consent documents and Pain Medication Agreement. We also discussed them in person together during this preop appointment. All of their questions were answered to their satisfaction.  Recommended calling if they have any further questions.  Risk consent form and Pain Medication Agreement to be scanned into patient's chart.  The risk that can be encountered with breast reduction were discussed and include the following but not limited to these:  Breast asymmetry, fluid accumulation, firmness of the breast, inability to breast feed, loss of nipple or areola, skin loss, decrease or no nipple sensation, fat necrosis of the breast tissue, bleeding, infection, healing delay.  There are risks of anesthesia, changes to skin sensation and injury to nerves or blood vessels.  The muscle can be temporarily or permanently injured.  You may have an allergic reaction to tape, suture, glue, blood products which can result in skin discoloration, swelling, pain, skin lesions, poor healing.  Any of these can lead to the need for revisonal surgery or stage procedures.  A reduction has potential to interfere with diagnostic procedures.  Nipple or breast piercing can increase risks of infection.  This procedure is best done when the breast is fully developed.  Changes in the breast will continue to occur over time.  Pregnancy can alter the outcomes of previous breast reduction surgery, weight gain and weigh loss can also effect the long term appearance.   Recommend holding Adderall day of procedure.  Electronically signed by: Carola Rhine Raivyn Kabler, PA-C 03/14/2022 10:04 AM

## 2022-03-19 ENCOUNTER — Other Ambulatory Visit: Payer: Self-pay

## 2022-03-19 ENCOUNTER — Ambulatory Visit (HOSPITAL_BASED_OUTPATIENT_CLINIC_OR_DEPARTMENT_OTHER)
Admission: RE | Admit: 2022-03-19 | Discharge: 2022-03-19 | Disposition: A | Discharge status: Home / Self Care | Payer: BLUE CROSS/BLUE SHIELD | Source: Ambulatory Visit | Attending: Plastic Surgery | Admitting: Plastic Surgery

## 2022-03-19 ENCOUNTER — Encounter (HOSPITAL_BASED_OUTPATIENT_CLINIC_OR_DEPARTMENT_OTHER)
Admission: RE | Disposition: A | Discharge status: Home / Self Care | Payer: Self-pay | Source: Ambulatory Visit | Attending: Plastic Surgery

## 2022-03-19 ENCOUNTER — Ambulatory Visit (HOSPITAL_BASED_OUTPATIENT_CLINIC_OR_DEPARTMENT_OTHER): Payer: BLUE CROSS/BLUE SHIELD | Admitting: Anesthesiology

## 2022-03-19 ENCOUNTER — Encounter (HOSPITAL_BASED_OUTPATIENT_CLINIC_OR_DEPARTMENT_OTHER): Payer: Self-pay | Admitting: Plastic Surgery

## 2022-03-19 DIAGNOSIS — N62 Hypertrophy of breast: Secondary | ICD-10-CM | POA: Diagnosis not present

## 2022-03-19 DIAGNOSIS — G8929 Other chronic pain: Secondary | ICD-10-CM | POA: Diagnosis not present

## 2022-03-19 DIAGNOSIS — N6011 Diffuse cystic mastopathy of right breast: Secondary | ICD-10-CM | POA: Diagnosis not present

## 2022-03-19 DIAGNOSIS — M546 Pain in thoracic spine: Secondary | ICD-10-CM | POA: Diagnosis not present

## 2022-03-19 DIAGNOSIS — Z87891 Personal history of nicotine dependence: Secondary | ICD-10-CM | POA: Insufficient documentation

## 2022-03-19 DIAGNOSIS — Z803 Family history of malignant neoplasm of breast: Secondary | ICD-10-CM | POA: Insufficient documentation

## 2022-03-19 DIAGNOSIS — N6081 Other benign mammary dysplasias of right breast: Secondary | ICD-10-CM | POA: Diagnosis not present

## 2022-03-19 DIAGNOSIS — N6082 Other benign mammary dysplasias of left breast: Secondary | ICD-10-CM | POA: Diagnosis not present

## 2022-03-19 DIAGNOSIS — N6012 Diffuse cystic mastopathy of left breast: Secondary | ICD-10-CM | POA: Diagnosis not present

## 2022-03-19 HISTORY — PX: BREAST REDUCTION SURGERY: SHX8

## 2022-03-19 SURGERY — MAMMOPLASTY, REDUCTION
Anesthesia: General | Site: Breast | Laterality: Bilateral

## 2022-03-19 MED ORDER — FENTANYL CITRATE (PF) 100 MCG/2ML IJ SOLN
INTRAMUSCULAR | Status: AC
  Filled 2022-03-19: qty 2

## 2022-03-19 MED ORDER — OXYCODONE HCL 5 MG/5ML PO SOLN: 5.0000 mg | Freq: Once | ORAL | Status: AC | PRN

## 2022-03-19 MED ORDER — OXYCODONE HCL 5 MG PO TABS
ORAL_TABLET | ORAL | Status: AC
  Filled 2022-03-19: qty 1

## 2022-03-19 MED ORDER — LIDOCAINE 2% (20 MG/ML) 5 ML SYRINGE
INTRAMUSCULAR | Status: AC
  Filled 2022-03-19: qty 10

## 2022-03-19 MED ORDER — SUGAMMADEX SODIUM 200 MG/2ML IV SOLN
INTRAVENOUS | Status: DC | PRN
  Administered 2022-03-19: 200 mg via INTRAVENOUS

## 2022-03-19 MED ORDER — ONDANSETRON HCL 4 MG/2ML IJ SOLN: 4.0000 mg | Freq: Once | INTRAMUSCULAR | Status: DC | PRN

## 2022-03-19 MED ORDER — MIDAZOLAM HCL 2 MG/2ML IJ SOLN
INTRAMUSCULAR | Status: AC
  Filled 2022-03-19: qty 2

## 2022-03-19 MED ORDER — LACTATED RINGERS IV SOLN: INTRAVENOUS | Status: DC | PRN

## 2022-03-19 MED ORDER — LIDOCAINE HCL (CARDIAC) PF 100 MG/5ML IV SOSY
PREFILLED_SYRINGE | INTRAVENOUS | Status: DC | PRN
  Administered 2022-03-19: 100 mg via INTRAVENOUS

## 2022-03-19 MED ORDER — ROCURONIUM BROMIDE 100 MG/10ML IV SOLN
INTRAVENOUS | Status: DC | PRN
  Administered 2022-03-19: 60 mg via INTRAVENOUS

## 2022-03-19 MED ORDER — PROPOFOL 10 MG/ML IV BOLUS
INTRAVENOUS | Status: DC | PRN
  Administered 2022-03-19: 140 mg via INTRAVENOUS

## 2022-03-19 MED ORDER — DEXAMETHASONE SODIUM PHOSPHATE 4 MG/ML IJ SOLN
INTRAMUSCULAR | Status: DC | PRN
  Administered 2022-03-19: 10 mg via INTRAVENOUS

## 2022-03-19 MED ORDER — LACTATED RINGERS IV SOLN
INTRAVENOUS | Status: DC | PRN
  Administered 2022-03-19: 1000 mL

## 2022-03-19 MED ORDER — CHLORHEXIDINE GLUCONATE CLOTH 2 % EX PADS: 6.0000 | MEDICATED_PAD | Freq: Once | CUTANEOUS | Status: DC

## 2022-03-19 MED ORDER — 0.9 % SODIUM CHLORIDE (POUR BTL) OPTIME
TOPICAL | Status: DC | PRN
  Administered 2022-03-19: 200 mL

## 2022-03-19 MED ORDER — AMISULPRIDE (ANTIEMETIC) 5 MG/2ML IV SOLN: 10.0000 mg | Freq: Once | INTRAVENOUS | Status: DC | PRN

## 2022-03-19 MED ORDER — CEFAZOLIN SODIUM-DEXTROSE 2-4 GM/100ML-% IV SOLN
INTRAVENOUS | Status: AC
  Filled 2022-03-19: qty 100

## 2022-03-19 MED ORDER — ACETAMINOPHEN 500 MG PO TABS
1000.0000 mg | ORAL_TABLET | Freq: Once | ORAL | Status: AC
Start: 2022-03-19 — End: 2022-03-19
  Administered 2022-03-19: 1000 mg via ORAL

## 2022-03-19 MED ORDER — CEFAZOLIN SODIUM-DEXTROSE 2-4 GM/100ML-% IV SOLN
2.0000 g | INTRAVENOUS | Status: AC
  Administered 2022-03-19: 2 g via INTRAVENOUS

## 2022-03-19 MED ORDER — FENTANYL CITRATE (PF) 100 MCG/2ML IJ SOLN
25.0000 ug | INTRAMUSCULAR | Status: DC | PRN
  Administered 2022-03-19 (×3): 50 ug via INTRAVENOUS

## 2022-03-19 MED ORDER — DEXAMETHASONE SODIUM PHOSPHATE 10 MG/ML IJ SOLN
INTRAMUSCULAR | Status: AC
  Filled 2022-03-19: qty 1

## 2022-03-19 MED ORDER — FENTANYL CITRATE (PF) 100 MCG/2ML IJ SOLN
INTRAMUSCULAR | Status: DC | PRN
  Administered 2022-03-19 (×4): 50 ug via INTRAVENOUS

## 2022-03-19 MED ORDER — LACTATED RINGERS IV SOLN: INTRAVENOUS | Status: DC

## 2022-03-19 MED ORDER — ACETAMINOPHEN 500 MG PO TABS
ORAL_TABLET | ORAL | Status: AC
  Filled 2022-03-19: qty 2

## 2022-03-19 MED ORDER — OXYCODONE HCL 5 MG PO TABS
5.0000 mg | ORAL_TABLET | Freq: Once | ORAL | Status: AC | PRN
  Administered 2022-03-19: 5 mg via ORAL

## 2022-03-19 MED ORDER — ONDANSETRON HCL 4 MG/2ML IJ SOLN
INTRAMUSCULAR | Status: AC
  Filled 2022-03-19: qty 2

## 2022-03-19 MED ORDER — MIDAZOLAM HCL 5 MG/5ML IJ SOLN
INTRAMUSCULAR | Status: DC | PRN
  Administered 2022-03-19: 2 mg via INTRAVENOUS

## 2022-03-19 SURGICAL SUPPLY — 74 items
APL PRP STRL LF DISP 70% ISPRP (MISCELLANEOUS) ×2
APL SKNCLS STERI-STRIP NONHPOA (GAUZE/BANDAGES/DRESSINGS) ×2
BENZOIN TINCTURE PRP APPL 2/3 (GAUZE/BANDAGES/DRESSINGS) ×2 IMPLANT
BINDER BREAST LRG (GAUZE/BANDAGES/DRESSINGS) IMPLANT
BINDER BREAST MEDIUM (GAUZE/BANDAGES/DRESSINGS) IMPLANT
BINDER BREAST XLRG (GAUZE/BANDAGES/DRESSINGS) IMPLANT
BINDER BREAST XXLRG (GAUZE/BANDAGES/DRESSINGS) IMPLANT
BIOPATCH RED 1 DISK 7.0 (GAUZE/BANDAGES/DRESSINGS) IMPLANT
BLADE SURG 10 STRL SS (BLADE) ×4 IMPLANT
BLADE SURG 15 STRL LF DISP TIS (BLADE) ×1 IMPLANT
BLADE SURG 15 STRL SS (BLADE) ×1
BNDG GAUZE DERMACEA FLUFF 4 (GAUZE/BANDAGES/DRESSINGS) ×2 IMPLANT
BNDG GZE DERMACEA 4 6PLY (GAUZE/BANDAGES/DRESSINGS) ×2
CANISTER SUCT 1200ML W/VALVE (MISCELLANEOUS) ×1 IMPLANT
CHLORAPREP W/TINT 26 (MISCELLANEOUS) ×2 IMPLANT
COVER BACK TABLE 60X90IN (DRAPES) ×1 IMPLANT
COVER MAYO STAND STRL (DRAPES) ×1 IMPLANT
DRAIN CHANNEL 15F RND FF W/TCR (WOUND CARE) IMPLANT
DRAPE LAPAROSCOPIC ABDOMINAL (DRAPES) ×1 IMPLANT
DRAPE UTILITY XL STRL (DRAPES) ×1 IMPLANT
DRESSING MEPILEX FLEX 4X4 (GAUZE/BANDAGES/DRESSINGS) ×2 IMPLANT
DRSG MEPILEX FLEX 4X4 (GAUZE/BANDAGES/DRESSINGS)
DRSG MEPILEX POST OP 4X8 (GAUZE/BANDAGES/DRESSINGS) IMPLANT
ELECT BLADE 4.0 EZ CLEAN MEGAD (MISCELLANEOUS) ×2
ELECT COATED BLADE 2.86 ST (ELECTRODE) ×1 IMPLANT
ELECT REM PT RETURN 9FT ADLT (ELECTROSURGICAL) ×2
ELECTRODE BLDE 4.0 EZ CLN MEGD (MISCELLANEOUS) IMPLANT
ELECTRODE REM PT RTRN 9FT ADLT (ELECTROSURGICAL) ×2 IMPLANT
EVACUATOR SILICONE 100CC (DRAIN) IMPLANT
GAUZE PAD ABD 8X10 STRL (GAUZE/BANDAGES/DRESSINGS) ×2 IMPLANT
GAUZE SPONGE 4X4 12PLY STRL (GAUZE/BANDAGES/DRESSINGS) ×2 IMPLANT
GAUZE XEROFORM 5X9 LF (GAUZE/BANDAGES/DRESSINGS) IMPLANT
GLOVE BIOGEL M STRL SZ7.5 (GLOVE) ×1 IMPLANT
GLOVE BIOGEL PI IND STRL 7.5 (GLOVE) ×1 IMPLANT
GLOVE SURG SS PI 7.5 STRL IVOR (GLOVE) ×1 IMPLANT
GOWN STRL REUS W/ TWL LRG LVL3 (GOWN DISPOSABLE) ×1 IMPLANT
GOWN STRL REUS W/TWL LRG LVL3 (GOWN DISPOSABLE) ×1
GOWN STRL REUS W/TWL XL LVL3 (GOWN DISPOSABLE) ×2 IMPLANT
MARKER SKIN DUAL TIP RULER LAB (MISCELLANEOUS) IMPLANT
NDL FILTER BLUNT 18X1 1/2 (NEEDLE) ×1 IMPLANT
NDL HYPO 25X1 1.5 SAFETY (NEEDLE) IMPLANT
NDL SAFETY ECLIP 18X1.5 (MISCELLANEOUS) ×1 IMPLANT
NDL SPNL 18GX3.5 QUINCKE PK (NEEDLE) IMPLANT
NEEDLE FILTER BLUNT 18X 1/2SAF (NEEDLE) ×1
NEEDLE FILTER BLUNT 18X1 1/2 (NEEDLE) ×1 IMPLANT
NEEDLE HYPO 25X1 1.5 SAFETY (NEEDLE) ×1 IMPLANT
NEEDLE SPNL 18GX3.5 QUINCKE PK (NEEDLE) ×1 IMPLANT
NS IRRIG 1000ML POUR BTL (IV SOLUTION) ×1 IMPLANT
PACK BASIN DAY SURGERY FS (CUSTOM PROCEDURE TRAY) ×1 IMPLANT
PENCIL SMOKE EVACUATOR (MISCELLANEOUS) ×2 IMPLANT
SLEEVE SCD COMPRESS KNEE MED (STOCKING) ×1 IMPLANT
SPONGE T-LAP 18X18 ~~LOC~~+RFID (SPONGE) ×3 IMPLANT
STAPLER INSORB 30 2030 C-SECTI (MISCELLANEOUS) ×1 IMPLANT
STAPLER VISISTAT 35W (STAPLE) ×1 IMPLANT
STRIP SUTURE WOUND CLOSURE 1/2 (MISCELLANEOUS) ×3 IMPLANT
SUT CHROMIC 4 0 PS 2 18 (SUTURE) IMPLANT
SUT MON AB 5-0 PS2 18 (SUTURE) ×1 IMPLANT
SUT PDS 3-0 CT2 (SUTURE) ×4
SUT PDS AB 4-0 SH 27 (SUTURE) ×4 IMPLANT
SUT PDS II 3-0 CT2 27 ABS (SUTURE) ×4 IMPLANT
SUT SILK 2 0 SH (SUTURE) IMPLANT
SUT SILK 3 0 SH CR/8 (SUTURE) IMPLANT
SUT VIC AB 2-0 SH 27 (SUTURE) ×2
SUT VIC AB 2-0 SH 27XBRD (SUTURE) ×4 IMPLANT
SUT VLOC 90 3-0 CLR P12 (SUTURE) ×2 IMPLANT
SYR 50ML LL SCALE MARK (SYRINGE) IMPLANT
SYR BULB IRRIG 60ML STRL (SYRINGE) ×1 IMPLANT
SYR CONTROL 10ML LL (SYRINGE) IMPLANT
TAPE MEASURE VINYL STERILE (MISCELLANEOUS) IMPLANT
TOWEL GREEN STERILE FF (TOWEL DISPOSABLE) ×2 IMPLANT
TUBE CONNECTING 20X1/4 (TUBING) ×1 IMPLANT
TUBING INFILTRATION IT-10001 (TUBING) ×1 IMPLANT
UNDERPAD 30X36 HEAVY ABSORB (UNDERPADS AND DIAPERS) ×2 IMPLANT
YANKAUER SUCT BULB TIP NO VENT (SUCTIONS) ×1 IMPLANT

## 2022-03-19 NOTE — Discharge Instructions (Addendum)
Activity As tolerated. NO showers for 3 days. Keep breast wrap on breasts until then. After showering, put wrap back on, this is important for compression. NO driving while in pain, taking pain medication or if you are unable to safely react to traffic. No heavy activities Take Pain medication as needed for severe pain. Otherwise, you can use ibuprofen and tylenol as needed. Avoid more than 3,000 mg of tylenol in 24 hours.  You do not need an antibiotic post-operatively unless this was discussed with the provider at your pre-op appointment.  Diet: Regular. Drink plenty of fluids and eat healthy (high protein, low carbs), Try to optimize your nutrition with plenty of fruits and vegetables to improve healing. Protein shakes are a good option.  Wound Care: Keep dressing clean & dry. You may change bandages after showering if you continue to notice some drainage. You can reuse bandages if they are not dirty/soiled. Mild wound drainage is common after breast reduction surgery and should not be cause for alarm.  Special Instructions: Call Doctor if any unusual problems occur such as pain, excessive Bleeding, unrelieved Nausea/vomiting, Fever &/or chills  Follow-up appointment: Previously scheduled.   Post Anesthesia Home Care Instructions  Activity: Get plenty of rest for the remainder of the day. A responsible individual must stay with you for 24 hours following the procedure.  For the next 24 hours, DO NOT: -Drive a car -Paediatric nurse -Drink alcoholic beverages -Take any medication unless instructed by your physician -Make any legal decisions or sign important papers.  Meals: Start with liquid foods such as gelatin or soup. Progress to regular foods as tolerated. Avoid greasy, spicy, heavy foods. If nausea and/or vomiting occur, drink only clear liquids until the nausea and/or vomiting subsides. Call your physician if vomiting continues.  Special Instructions/Symptoms: Your throat  may feel dry or sore from the anesthesia or the breathing tube placed in your throat during surgery. If this causes discomfort, gargle with warm salt water. The discomfort should disappear within 24 hours.  If you had a scopolamine patch placed behind your ear for the management of post- operative nausea and/or vomiting:  1. The medication in the patch is effective for 72 hours, after which it should be removed.  Wrap patch in a tissue and discard in the trash. Wash hands thoroughly with soap and water. 2. You may remove the patch earlier than 72 hours if you experience unpleasant side effects which may include dry mouth, dizziness or visual disturbances. 3. Avoid touching the patch. Wash your hands with soap and water after contact with the patch.

## 2022-03-19 NOTE — Anesthesia Postprocedure Evaluation (Signed)
Anesthesia Post Note  Patient: ALLEAN MONTFORT  Procedure(s) Performed: Bilateral breast reduction (Bilateral: Breast)     Patient location during evaluation: PACU Anesthesia Type: General Level of consciousness: awake and alert Pain management: pain level controlled Vital Signs Assessment: post-procedure vital signs reviewed and stable Respiratory status: spontaneous breathing, nonlabored ventilation and respiratory function stable Cardiovascular status: blood pressure returned to baseline and stable Postop Assessment: no apparent nausea or vomiting Anesthetic complications: no   No notable events documented.  Last Vitals:  Vitals:   03/19/22 1415 03/19/22 1430  BP: (!) 153/89 (!) 146/92  Pulse: 67 76  Resp: 13 15  Temp:    SpO2: 100% 99%    Last Pain:  Vitals:   03/19/22 1430  TempSrc:   PainSc: 6                  Lidia Collum

## 2022-03-19 NOTE — Interval H&P Note (Signed)
History and Physical Interval Note:  03/19/2022 11:06 AM  Lisa Baird  has presented today for surgery, with the diagnosis of Breast hypertrophy.  The various methods of treatment have been discussed with the patient and family. After consideration of risks, benefits and other options for treatment, the patient has consented to  Procedure(s): Bilateral breast reduction (Bilateral) as a surgical intervention.  The patient's history has been reviewed, patient examined, no change in status, stable for surgery.  I have reviewed the patient's chart and labs.  Questions were answered to the patient's satisfaction.     Lennice Sites

## 2022-03-19 NOTE — Anesthesia Procedure Notes (Signed)
Procedure Name: Intubation Date/Time: 03/19/2022 11:24 AM  Performed by: Tawni Millers, CRNAPre-anesthesia Checklist: Patient identified, Emergency Drugs available, Suction available and Patient being monitored Patient Re-evaluated:Patient Re-evaluated prior to induction Oxygen Delivery Method: Circle system utilized Preoxygenation: Pre-oxygenation with 100% oxygen Induction Type: IV induction Ventilation: Mask ventilation without difficulty Laryngoscope Size: Mac and 3 Grade View: Grade I Tube type: Oral Tube size: 7.0 mm Number of attempts: 1 Airway Equipment and Method: Stylet and Oral airway Placement Confirmation: ETT inserted through vocal cords under direct vision, positive ETCO2 and breath sounds checked- equal and bilateral Tube secured with: Tape Dental Injury: Teeth and Oropharynx as per pre-operative assessment

## 2022-03-19 NOTE — Transfer of Care (Signed)
Immediate Anesthesia Transfer of Care Note  Patient: Lisa Baird  Procedure(s) Performed: Bilateral breast reduction (Bilateral: Breast)  Patient Location: PACU  Anesthesia Type:General  Level of Consciousness: awake  Airway & Oxygen Therapy: Patient Spontanous Breathing and Patient connected to face mask oxygen  Post-op Assessment: Report given to RN and Post -op Vital signs reviewed and stable  Post vital signs: Reviewed and stable  Last Vitals:  Vitals Value Taken Time  BP 157/100 03/19/22 1345  Temp    Pulse 77 03/19/22 1346  Resp 16 03/19/22 1346  SpO2 100 % 03/19/22 1346  Vitals shown include unvalidated device data.  Last Pain:  Vitals:   03/19/22 1026  TempSrc: Oral  PainSc: 0-No pain         Complications: No notable events documented.

## 2022-03-19 NOTE — Op Note (Signed)
Operative Note   DATE OF OPERATION: 03/19/2022  LOCATION: [facility]   SURGICAL DEPARTMENT: Plastic Surgery  PREOPERATIVE DIAGNOSES: Bilateral symptomatic macromastia.  POSTOPERATIVE DIAGNOSES:  same  PROCEDURE: Bilateral breast reduction with superomedial pedicle.  SURGEON: Lennice Sites, MD  ASSISTANT: Verdie Shire, [facility]  ANESTHESIA: General.  COMPLICATIONS: None.   INDICATIONS FOR PROCEDURE:  The patient, Lisa Baird is a 46 y.o. female born on 12-17-1975, is here for treatment of bilateral symptomatic macromastia. MRN: [id number]  CONSENT:  Informed consent was obtained directly from the patient. Risks, benefits and alternatives were fully discussed. Specific risks including but not limited to bleeding, infection, hematoma, seroma, scarring, pain, infection, contracture, asymmetry, wound healing problems, and need for further surgery were all discussed. The patient did have an ample opportunity to have questions answered to satisfaction.   DESCRIPTION OF PROCEDURE:  The patient was marked preoperatively for a Wise pattern skin excision.  The patient was taken to the operating room. SCDs were placed and antibiotics were given. General anesthesia was administered.The patient's operative site was prepped and draped in a sterile fashion. A time out was performed and all information was confirmed to be correct.  Right Breast: The breast was infiltrated with tumescent solution to help with hemostasis.  The nipple was marked with a cookie cutter.  A superomedial pedicle was drawn out with the base of at least 8 cm in size.  A breast tourniquet was then applied and the pedicle was de-epithelialized.  Breast tourniquet was then let down and all incisions were made with a 10 blade.  The pedicle was then isolated down to the chest wall with cautery and the excision was performed removing tissue primarily inferiorly and laterally.  Hemostasis was obtained and the wound was stapled  closed.  Left breast:  The breast was infiltrated with tumescent solution to help with hemostasis.  The nipple was marked with a cookie cutter.  A superomedial pedicle was drawn out with the base of at least 8 cm in size.  A breast tourniquet was then applied and the pedicle was de-epithelialized.  Breast tourniquet was then let down and all incisions were made with a 10 blade.  The pedicle was then isolated down to the chest wall with cautery and the excision was performed removing tissue primarily inferiorly and laterally.  Hemostasis was obtained and the wound was stapled closed.  Patient was then set up to check for size and symmetry.  Minor modifications were made.  This resulted in a total of 454 g removed from the right side and 454 g removed from the left side.  The inframammary incision was closed with a combination of buried in-sorb staples, 3-0 PDS and a running V-lock 90.  The vertical and periareolar limbs were closed with interrupted buried 4-0 PDS and a running V-lock 90 suture.  Steri-Strips were then applied along with a soft dressing and breast binder.  The advanced practice practitioner (APP) assisted throughout the case.  The APP was essential in retraction and counter traction when needed to make the case progress smoothly.  This retraction and assistance made it possible to see the tissue planes for the procedure.  The assistance was needed for hemostasis, tissue re-approximation and closure of the incision site.    The patient tolerated the procedure well.  There were no complications. The patient was allowed to wake from anesthesia, extubated and taken to the recovery room in satisfactory condition.  I was present for the entire procedure.

## 2022-03-19 NOTE — Anesthesia Preprocedure Evaluation (Signed)
Anesthesia Evaluation  Patient identified by MRN, date of birth, ID band Patient awake    Reviewed: Allergy & Precautions, NPO status , Patient's Chart, lab work & pertinent test results  History of Anesthesia Complications Negative for: history of anesthetic complications  Airway Mallampati: II  TM Distance: >3 FB Neck ROM: Full    Dental  (+) Dental Advisory Given, Teeth Intact   Pulmonary neg pulmonary ROS, former smoker,    Pulmonary exam normal        Cardiovascular negative cardio ROS Normal cardiovascular exam     Neuro/Psych Anxiety Bipolar Disorder negative neurological ROS     GI/Hepatic negative GI ROS, Neg liver ROS,   Endo/Other  negative endocrine ROS  Renal/GU negative Renal ROS  negative genitourinary   Musculoskeletal negative musculoskeletal ROS (+)   Abdominal   Peds  Hematology negative hematology ROS (+)   Anesthesia Other Findings   Reproductive/Obstetrics                             Anesthesia Physical Anesthesia Plan  ASA: 2  Anesthesia Plan: General   Post-op Pain Management: Tylenol PO (pre-op)* and Toradol IV (intra-op)*   Induction: Intravenous  PONV Risk Score and Plan: 3 and Ondansetron, Dexamethasone, Midazolam and Treatment may vary due to age or medical condition  Airway Management Planned: Oral ETT  Additional Equipment: None  Intra-op Plan:   Post-operative Plan: Extubation in OR  Informed Consent: I have reviewed the patients History and Physical, chart, labs and discussed the procedure including the risks, benefits and alternatives for the proposed anesthesia with the patient or authorized representative who has indicated his/her understanding and acceptance.     Dental advisory given  Plan Discussed with:   Anesthesia Plan Comments:         Anesthesia Quick Evaluation

## 2022-03-20 ENCOUNTER — Encounter: Payer: Self-pay | Admitting: Plastic Surgery

## 2022-03-20 ENCOUNTER — Encounter (HOSPITAL_BASED_OUTPATIENT_CLINIC_OR_DEPARTMENT_OTHER): Payer: Self-pay | Admitting: Plastic Surgery

## 2022-03-20 LAB — SURGICAL PATHOLOGY

## 2022-03-28 ENCOUNTER — Encounter: Payer: BLUE CROSS/BLUE SHIELD | Admitting: Surgical

## 2022-03-28 NOTE — Progress Notes (Deleted)
Patient is a 46 year old female here for follow-up after bilateral breast reduction with Dr. Erin Hearing on 03/19/2022.  She is just over 1 week postop.

## 2022-04-08 ENCOUNTER — Ambulatory Visit (INDEPENDENT_AMBULATORY_CARE_PROVIDER_SITE_OTHER): Payer: BLUE CROSS/BLUE SHIELD | Admitting: Surgical

## 2022-04-08 ENCOUNTER — Encounter: Payer: Self-pay | Admitting: Surgical

## 2022-04-08 DIAGNOSIS — N62 Hypertrophy of breast: Secondary | ICD-10-CM

## 2022-04-08 NOTE — Progress Notes (Signed)
Patient is a 46 year old female here for follow-up after bilateral breast reduction with Dr. Erin Hearing on 03/19/2022.  She is approximately 3 weeks postop.   She reports today that she is doing really well.  She is not having any infectious symptoms.  She reports overall she is very happy, reports her neck and back feel much better.  She reports she had a "stomach bug" after surgery but has recovered well from this.  Reports that others in her family had this as well.  Chaperone present on exam On exam bilateral NAC's are viable, bilateral breast incisions are intact and healing well.  There is no subcutaneous fluid collection noted palpation.  No erythema or cellulitic changes noted.  She does have a small pinpoint wound that is approximately 1 x 1 mm on the right T-junction  Recommend continue with compressive garments, avoid strenuous activities for 3 more weeks.  She can return to exercising lightly on the treadmill, but avoid any upper body strengthening.  We discussed she can begin using scar cream in a few weeks.  Recommend following up in 3 weeks for reevaluation.  Recommend Vaseline gauze to right breast inframammary fold wound.  Pictures were obtained of the patient and placed in the chart with the patient's or guardian's permission.

## 2022-04-29 NOTE — Progress Notes (Deleted)
46 year old female here for follow-up after bilateral breast reduction with Dr. Erin Hearing on 03/19/2022.  She is 6 weeks postop.

## 2022-04-30 ENCOUNTER — Ambulatory Visit: Payer: BLUE CROSS/BLUE SHIELD | Admitting: Physician Assistant

## 2022-05-01 ENCOUNTER — Encounter: Payer: Self-pay | Admitting: Obstetrics and Gynecology

## 2022-05-02 ENCOUNTER — Ambulatory Visit (INDEPENDENT_AMBULATORY_CARE_PROVIDER_SITE_OTHER): Payer: BLUE CROSS/BLUE SHIELD

## 2022-05-02 VITALS — BP 138/95 | HR 95 | Ht 63.0 in | Wt 132.0 lb

## 2022-05-02 DIAGNOSIS — R300 Dysuria: Secondary | ICD-10-CM | POA: Diagnosis not present

## 2022-05-02 LAB — POCT URINALYSIS DIPSTICK
Bilirubin, UA: NEGATIVE
Glucose, UA: NEGATIVE
Ketones, UA: NEGATIVE
Nitrite, UA: NEGATIVE
Protein, UA: NEGATIVE
Spec Grav, UA: 1.01 (ref 1.010–1.025)
Urobilinogen, UA: 1 E.U./dL
pH, UA: 6 (ref 5.0–8.0)

## 2022-05-02 MED ORDER — SULFAMETHOXAZOLE-TRIMETHOPRIM 800-160 MG PO TABS: 1.0000 | ORAL_TABLET | Freq: Two times a day (BID) | ORAL | 1 refills | Status: DC

## 2022-05-06 LAB — URINE CULTURE

## 2022-05-12 DIAGNOSIS — Z9889 Other specified postprocedural states: Secondary | ICD-10-CM | POA: Insufficient documentation

## 2022-05-12 NOTE — Patient Instructions (Incomplete)
Living With Attention Deficit Hyperactivity Disorder If you have been diagnosed with attention deficit hyperactivity disorder (ADHD), you may be relieved that you now know why you have felt or behaved a certain way. Still, you may feel overwhelmed about the treatment ahead. You may also wonder how to get the support you need and how to deal with the condition day-to-day. With treatment and support, you can live with ADHD and manage your symptoms. How to manage lifestyle changes Managing lifestyle changes can be challenging. Seeking support from your healthcare provider, therapist, family, and friends can be helpful. How to recognize changes in your condition The following signs may mean that your treatment is working well and your condition is improving: Consistently being on time for appointments. Being more organized at home and work. Other people noticing improvements in your behavior. Achieving goals that you set for yourself. Thinking more clearly. The following signs may mean that your treatment is not working very well: Feeling impatience or more confusion. Missing, forgetting, or being late for appointments. An increasing sense of disorganization and messiness. More difficulty in reaching goals that you set for yourself. Loved ones becoming angry or frustrated with you. Follow these instructions at home: Medicines Take over-the-counter and prescription medicines only as told by your health care provider. Check with your health care provider before taking any new medicines. General instructions Create structure and an organized atmosphere at home. For example: Make a list of tasks, then rank them from most important to least important. Work on one task at a time until your listed tasks are done. Make a daily schedule and follow it consistently every day. Use an appointment calendar, and check it 2-3 times a day to keep on track. Keep it with you when you leave the house. Create  spaces where you keep certain things, and always put things back in their places after you use them. Keep all follow-up visits. Your health care provider will need to monitor your condition and adjust your treatment over time. Where to find support Talking to others  Keep emotion out of important discussions and speak in a calm, logical way. Listen closely and patiently to your loved ones. Try to understand their point of view, and try to avoid getting defensive. Take responsibility for the consequences of your actions. Ask that others do not take your behaviors personally. Aim to solve problems as they come up, and express your feelings instead of bottling them up. Talk openly about what you need from your loved ones and how they can support you. Consider going to family therapy sessions or having your family meet with a specialist who deals with ADHD-related behavior problems. Finances Not all insurance plans cover mental health care, so it is important to check with your insurance carrier. If paying for co-pays or counseling services is a problem, search for a local or county mental health care center. Public mental health care services may be offered there at a low cost or no cost when you are not able to see a private health care provider. If you are taking medicine for ADHD, you may be able to get the generic form, which may be less expensive than brand-name medicine. Some makers of prescription medicines also offer help to patients who cannot afford the medicines that they need. Therapy and support groups Talking with a mental health care provider and participating in support groups can help to improve your quality of life, daily functioning, and overall symptoms. Questions to ask your health   care provider: What are the risks and benefits of taking medicines? Would I benefit from therapy? How often should I follow up with a health care provider? Where to find more information Learn more  about ADHD from: Children and Adults with Attention Deficit Hyperactivity Disorder: chadd.org National Institute of Mental Health: nimh.nih.gov Centers for Disease Control and Prevention: cdc.gov Contact a health care provider if: You have side effects from your medicines, such as: Repeated muscle twitches, coughing, or speech outbursts. Sleep problems. Loss of appetite. Dizziness. Unusually fast heartbeat. Stomach pains. Headaches. You have new or worsening behavior problems. You are struggling with anxiety, depression, or substance abuse. Get help right away if: You have a severe reaction to a medicine. These symptoms may be an emergency. Get help right away. Call 911. Do not wait to see if the symptoms will go away. Do not drive yourself to the hospital. Take one of these steps if you feel like you may hurt yourself or others, or have thoughts about taking your own life: Go to your nearest emergency room. Call 911. Call the National Suicide Prevention Lifeline at 1-800-273-8255 or 988. This is open 24 hours a day. Text the Crisis Text Line at 741741. Summary With treatment and support, you can live with ADHD and manage your symptoms. Consider taking part in family therapy or self-help groups with family members or friends. When you talk with friends and family about your ADHD, be patient and communicate openly. Keep all follow-up visits. Your health care provider will need to monitor your condition and adjust your treatment over time. This information is not intended to replace advice given to you by your health care provider. Make sure you discuss any questions you have with your health care provider. Document Revised: 10/19/2021 Document Reviewed: 10/19/2021 Elsevier Patient Education  2023 Elsevier Inc.  

## 2022-05-14 ENCOUNTER — Encounter: Payer: BLUE CROSS/BLUE SHIELD | Admitting: Nurse Practitioner

## 2022-05-14 ENCOUNTER — Other Ambulatory Visit: Payer: Self-pay | Admitting: Nurse Practitioner

## 2022-05-14 DIAGNOSIS — F419 Anxiety disorder, unspecified: Secondary | ICD-10-CM

## 2022-05-14 DIAGNOSIS — F902 Attention-deficit hyperactivity disorder, combined type: Secondary | ICD-10-CM

## 2022-05-14 DIAGNOSIS — Z0000 Encounter for general adult medical examination without abnormal findings: Secondary | ICD-10-CM

## 2022-05-14 DIAGNOSIS — Z9889 Other specified postprocedural states: Secondary | ICD-10-CM

## 2022-05-14 DIAGNOSIS — Z13220 Encounter for screening for lipoid disorders: Secondary | ICD-10-CM

## 2022-05-14 DIAGNOSIS — Z114 Encounter for screening for human immunodeficiency virus [HIV]: Secondary | ICD-10-CM

## 2022-05-14 DIAGNOSIS — Z1159 Encounter for screening for other viral diseases: Secondary | ICD-10-CM

## 2022-05-14 DIAGNOSIS — E559 Vitamin D deficiency, unspecified: Secondary | ICD-10-CM

## 2022-05-14 MED ORDER — AMPHETAMINE-DEXTROAMPHETAMINE 20 MG PO TABS: 20.0000 mg | ORAL_TABLET | Freq: Two times a day (BID) | ORAL | 0 refills | Status: DC

## 2022-05-14 NOTE — Telephone Encounter (Signed)
Medication Refill - Medication: Adderall 20 . Mg   BId  Has the patient contacted their pharmacy? No. (Agent: If no, request that the patient contact the pharmacy for the refill. If patient does not wish to contact the pharmacy document the reason why and proceed with request.) (Agent: If yes, when and what did the pharmacy advise?)  Preferred Pharmacy (with phone number or street name): [address] Has the patient been seen for an appointment in the last year OR does the patient have an upcoming appointment? Yes.    Agent: Please be advised that RX refills may take up to 3 business days. We ask that you follow-up with your pharmacy.

## 2022-05-14 NOTE — Addendum Note (Signed)
Addended by: Marnee Guarneri T on: 05/14/2022 12:52 PM   Modules accepted: Orders

## 2022-05-14 NOTE — Telephone Encounter (Signed)
Requested medications are due for refill today.  no  Requested medications are on the active medications list.  yes  Last refill. 05/14/2022 #60 0 rf  Future visit scheduled.   yes  Notes to clinic.  Medication was refilled today. Rx sent to [facility]. This request is for rx to go to [facility].    Requested Prescriptions  Pending Prescriptions Disp Refills   amphetamine-dextroamphetamine (ADDERALL) 20 MG tablet 60 tablet 0    Sig: Take 1 tablet (20 mg total) by mouth 2 (two) times daily.     Not Delegated - Psychiatry:  Stimulants/ADHD Failed - 05/14/2022 12:58 PM      Failed - This refill cannot be delegated      Failed - Last BP in normal range    BP Readings from Last 1 Encounters:  05/02/22 (!) 138/95         Passed - Urine Drug Screen completed in last 360 days      Passed - Last Heart Rate in normal range    Pulse Readings from Last 1 Encounters:  05/02/22 95         Passed - Valid encounter within last 6 months    Recent Outpatient Visits           3 months ago Attention deficit hyperactivity disorder (ADHD), combined type   [facility] [place], Barbaraann Faster, NP   6 months ago Attention deficit hyperactivity disorder (ADHD), combined type   [facility] [place], Barbaraann Faster, NP   1 year ago Rectal bleeding   [place], Henrine Screws T, NP   1 year ago Rectal bleeding   [place], Henrine Screws T, NP   2 years ago Attention deficit hyperactivity disorder (ADHD), unspecified ADHD type   [place], Barbaraann Faster, NP       Future Appointments             In 3 weeks [place], Barbaraann Faster, NP [employer]

## 2022-05-28 DIAGNOSIS — F332 Major depressive disorder, recurrent severe without psychotic features: Secondary | ICD-10-CM | POA: Diagnosis not present

## 2022-06-04 DIAGNOSIS — F411 Generalized anxiety disorder: Secondary | ICD-10-CM | POA: Diagnosis not present

## 2022-06-09 NOTE — Patient Instructions (Signed)

## 2022-06-10 ENCOUNTER — Ambulatory Visit (INDEPENDENT_AMBULATORY_CARE_PROVIDER_SITE_OTHER): Payer: BLUE CROSS/BLUE SHIELD | Admitting: Nurse Practitioner

## 2022-06-10 ENCOUNTER — Encounter: Payer: Self-pay | Admitting: Nurse Practitioner

## 2022-06-10 VITALS — BP 119/78 | HR 57 | Temp 97.8°F | Ht 65.35 in | Wt 132.2 lb

## 2022-06-10 DIAGNOSIS — F902 Attention-deficit hyperactivity disorder, combined type: Secondary | ICD-10-CM | POA: Diagnosis not present

## 2022-06-10 DIAGNOSIS — Z136 Encounter for screening for cardiovascular disorders: Secondary | ICD-10-CM | POA: Diagnosis not present

## 2022-06-10 DIAGNOSIS — Z13220 Encounter for screening for lipoid disorders: Secondary | ICD-10-CM | POA: Diagnosis not present

## 2022-06-10 DIAGNOSIS — Z114 Encounter for screening for human immunodeficiency virus [HIV]: Secondary | ICD-10-CM | POA: Diagnosis not present

## 2022-06-10 DIAGNOSIS — Z0000 Encounter for general adult medical examination without abnormal findings: Secondary | ICD-10-CM

## 2022-06-10 DIAGNOSIS — F419 Anxiety disorder, unspecified: Secondary | ICD-10-CM

## 2022-06-10 DIAGNOSIS — E559 Vitamin D deficiency, unspecified: Secondary | ICD-10-CM | POA: Diagnosis not present

## 2022-06-10 DIAGNOSIS — Z1159 Encounter for screening for other viral diseases: Secondary | ICD-10-CM

## 2022-06-10 MED ORDER — AMPHETAMINE-DEXTROAMPHETAMINE 20 MG PO TABS: 20.0000 mg | ORAL_TABLET | Freq: Two times a day (BID) | ORAL | 0 refills | Status: DC

## 2022-06-10 NOTE — Assessment & Plan Note (Signed)
Chronic, stable.  Diagnosed by Dr. Julie Johnson with psychiatry in 2022.  Tolerating medication well with good symptom control, continue this.  She is agreeable to every 3 months visits + UDS annually.  Obain UDS next 11/10/22 and substance agreement on file.  Refills sent x 3 sent and predated.  Return in 3 months for further refills. 

## 2022-06-10 NOTE — Assessment & Plan Note (Addendum)
Chronic, ongoing.  Denies SI/HI.  She plans on restarting Celexa 20 MG at this time.  Is working with therapist and looking into Ketamine treatments.

## 2022-06-10 NOTE — Assessment & Plan Note (Signed)
Chronic, ongoing.  Noted past labs.  Recommend she start Vitamin D3 2000 units daily and will adjust as needed.  Labs today.

## 2022-06-10 NOTE — Progress Notes (Signed)
BP 119/78   Pulse (!) 57   Temp 97.8 F (36.6 C) (Oral)   Ht 5' 5.35" (1.66 m)   Wt 132 lb 3.2 oz (60 kg)   LMP 09/21/2020 (Approximate)   SpO2 98%   BMI 21.76 kg/m    Subjective:    Patient ID: Lisa Baird, female    DOB: 1975/09/30, 46 y.o.   MRN: [id number]  HPI: Lisa Baird is a 46 y.o. female presenting on 06/10/2022 for comprehensive medical examination. Current medical complaints include:none  She currently lives with: self Menopausal Symptoms: no  ADHD FOLLOW UP Continues on Adderall daily, last 05/14/22.  She plans on starting Celexa 20 MG back up for her mood. ADHD status: controlled Satisfied with current therapy: yes Medication compliance:  good compliance Controlled substance contract: yes Previous psychiatry evaluation: no Previous medications: none Taking meds on weekends/vacations: yes Work/school performance:  good Difficulty sustaining attention/completing tasks: no Distracted by extraneous stimuli: no Does not listen when spoken to: no  Fidgets with hands or feet: yes Unable to stay in seat: no Blurts out/interrupts others: no ADHD Medication Side Effects: no    Decreased appetite: no    Headache: no    Sleeping disturbance pattern: no    Irritability: no    Rebound effects (worse than baseline) off medication: no    Anxiousness: no    Dizziness: no    Tics: no      06/10/2022    1:05 PM 11/09/2021   11:25 AM 01/22/2019    8:39 AM  Depression screen PHQ 2/9  Decreased Interest 1 0 0  Down, Depressed, Hopeless 1 1 0  PHQ - 2 Score 2 1 0  Altered sleeping '1 2 3  '$ Tired, decreased energy 0 3 3  Change in appetite 0 1 0  Feeling bad or failure about yourself  0 0 0  Trouble concentrating '2 3 2  '$ Moving slowly or fidgety/restless 1 3 0  Suicidal thoughts 0 0 0  PHQ-9 Score '6 13 8  '$ Difficult doing work/chores Somewhat difficult Somewhat difficult Very difficult       06/10/2022    1:06 PM 11/09/2021   11:26 AM  GAD 7 : Generalized  Anxiety Score  Nervous, Anxious, on Edge 1 2  Control/stop worrying 0 1  Worry too much - different things 0 2  Trouble relaxing 1 2  Restless 1 1  Easily annoyed or irritable 2 1  Afraid - awful might happen 0 0  Total GAD 7 Score 5 9  Anxiety Difficulty Somewhat difficult Somewhat difficult   The patient does not have a history of falls. I did not complete a risk assessment for falls. A plan of care for falls was not documented.  Past Medical History:  Past Medical History:  Diagnosis Date   ADD (attention deficit disorder)    Anxiety    Bipolar disorder ([place])    COVID-19 04/2019   Endometriosis    Surgical History:  Past Surgical History:  Procedure Laterality Date   ABDOMINAL HYSTERECTOMY     APPENDECTOMY     BREAST REDUCTION SURGERY Bilateral 03/19/2022   Procedure: Bilateral breast reduction;  Surgeon: Lennice Sites, MD;  Location: [place];  Service: Plastics;  Laterality: Bilateral;   COLONOSCOPY WITH PROPOFOL N/A 07/25/2020   Procedure: COLONOSCOPY WITH PROPOFOL;  Surgeon: Virgel Manifold, MD;  Location: [place];  Service: Endoscopy;  Laterality: N/A;   EYE SURGERY     LAPAROSCOPIC  VAGINAL HYSTERECTOMY WITH SALPINGECTOMY Bilateral 11/10/2020   Procedure: LAPAROSCOPIC ASSISTED VAGINAL HYSTERECTOMY WITH SALPINGECTOMY;  Surgeon: Schermerhorn, Gwen Her, MD;  Location: [facility];  Service: Gynecology;  Laterality: Bilateral;   LAPAROSCOPY     Endometriosis   TONSILLECTOMY     WISDOM TOOTH EXTRACTION      Medications:  Current Outpatient Medications on File Prior to Visit  Medication Sig   citalopram (CELEXA) 20 MG tablet Take 20 mg by mouth daily.   ondansetron (ZOFRAN) 4 MG tablet Take 1 tablet (4 mg total) by mouth every 8 (eight) hours as needed for nausea or vomiting.   No current facility-administered medications on file prior to visit.    Allergies:  Allergies  Allergen Reactions   Tegretol [Carbamazepine] Swelling and  Rash   Lactose Intolerance (Gi)     GI Distress    Social History:  Social History   Socioeconomic History   Marital status: Divorced    Spouse name: Harrell Gave   Number of children: Not on file   Years of education: Not on file   Highest education level: Not on file  Occupational History   Occupation: Psychiatric nurse: [employer]  Tobacco Use   Smoking status: Former    Types: Cigarettes    Quit date: 07/2017    Years since quitting: 4.9   Smokeless tobacco: Never  Vaping Use   Vaping Use: Former  Substance and Sexual Activity   Alcohol use: Yes    Comment: Socially   Drug use: Never    Comment: Marijuana as a teenager   Sexual activity: Not Currently    Partners: Male    Birth control/protection: Surgical  Other Topics Concern   Not on file  Social History Narrative   Not on file   Social Determinants of Health   Financial Resource Strain: Low Risk  (01/22/2019)   Overall Financial Resource Strain (CARDIA)    Difficulty of Paying Living Expenses: Not hard at all  Food Insecurity: No [place] (01/22/2019)   Hunger Vital Sign    Worried About Running Out of Food in the Last Year: Never true    [place] in the Last Year: Never true  Transportation Needs: No Transportation Needs (01/22/2019)   PRAPARE - Hydrologist (Medical): No    Lack of Transportation (Non-Medical): No  Physical Activity: Insufficiently Active (01/22/2019)   Exercise Vital Sign    Days of Exercise per Week: 3 days    Minutes of Exercise per Session: 30 min  Stress: No Stress Concern Present (01/22/2019)   [place]    Feeling of Stress : Only a little  Social Connections: Unknown (01/22/2019)   Social Connection and Isolation Panel [NHANES]    Frequency of Communication with Friends and Family: More than three times a week    Frequency of Social Gatherings with Friends and Family: More  than three times a week    Attends Religious Services: Never    Marine scientist or Organizations: No    Attends Archivist Meetings: Never    Marital Status: Not on file  Intimate Partner Violence: Not At Risk (01/22/2019)   Humiliation, Afraid, Rape, and Kick questionnaire    Fear of Current or Ex-Partner: No    Emotionally Abused: No    Physically Abused: No    Sexually Abused: No   Social History   Tobacco Use  Smoking  Status Former   Types: Cigarettes   Quit date: 07/2017   Years since quitting: 4.9  Smokeless Tobacco Never   Social History   Substance and Sexual Activity  Alcohol Use Yes   Comment: Socially    Family History:  Family History  Problem Relation Age of Onset   Emphysema Mother    Prostate cancer Father    Hyperlipidemia Father    Hypertension Father    Breast cancer Maternal Aunt 24   Diabetes Paternal Uncle    Brain cancer Paternal Uncle    Hypertension Maternal Grandmother    Heart disease Maternal Grandmother     Past medical history, surgical history, medications, allergies, family history and social history reviewed with patient today and changes made to appropriate areas of the chart.   ROS All other ROS negative except what is listed above and in the HPI.      Objective:    BP 119/78   Pulse (!) 57   Temp 97.8 F (36.6 C) (Oral)   Ht 5' 5.35" (1.66 m)   Wt 132 lb 3.2 oz (60 kg)   LMP 09/21/2020 (Approximate)   SpO2 98%   BMI 21.76 kg/m   Wt Readings from Last 3 Encounters:  06/10/22 132 lb 3.2 oz (60 kg)  05/02/22 132 lb (59.9 kg)  03/19/22 128 lb 15.5 oz (58.5 kg)    Physical Exam Vitals and nursing note reviewed.  Constitutional:      General: She is awake.     Appearance: She is well-developed.  HENT:     Head: Normocephalic.     Right Ear: Hearing normal.     Left Ear: Hearing normal.     Nose: Nose normal.     Mouth/Throat:     Mouth: Mucous membranes are moist.  Eyes:     General: Lids are  normal.        Right eye: No discharge.        Left eye: No discharge.     Conjunctiva/sclera: Conjunctivae normal.     Pupils: Pupils are equal, round, and reactive to light.  Neck:     Thyroid: No thyromegaly.     Vascular: No carotid bruit or JVD.  Cardiovascular:     Rate and Rhythm: Normal rate and regular rhythm.     Heart sounds: Normal heart sounds. No murmur heard.    No gallop.  Pulmonary:     Effort: Pulmonary effort is normal.     Breath sounds: Normal breath sounds.  Abdominal:     General: Bowel sounds are normal.     Palpations: Abdomen is soft. There is no hepatomegaly or splenomegaly.  Musculoskeletal:     Cervical back: Normal range of motion and neck supple.     Right lower leg: No edema.     Left lower leg: No edema.  Lymphadenopathy:     Cervical: No cervical adenopathy.  Skin:    General: Skin is warm and dry.  Neurological:     Mental Status: She is alert and oriented to person, place, and time.  Psychiatric:        Attention and Perception: Attention normal.        Mood and Affect: Mood normal.        Behavior: Behavior normal. Behavior is cooperative.        Thought Content: Thought content normal.        Judgment: Judgment normal.    Results for orders placed or performed in  visit on 05/02/22  Urine Culture   Specimen: Urine   Urine  Result Value Ref Range   Urine Culture, Routine Final report (A)    Organism ID, Bacteria Escherichia coli (A)    Antimicrobial Susceptibility Comment   POCT urinalysis dipstick  Result Value Ref Range   Color, UA     Clarity, UA     Glucose, UA Negative Negative   Bilirubin, UA neg    Ketones, UA neg    Spec Grav, UA 1.010 1.010 - 1.025   Blood, UA trace    pH, UA 6.0 5.0 - 8.0   Protein, UA Negative Negative   Urobilinogen, UA 1.0 0.2 or 1.0 E.U./dL   Nitrite, UA negative    Leukocytes, UA Moderate (2+) (A) Negative   Appearance     Odor        Assessment & Plan:   Problem List Items Addressed  This Visit       Other   ADHD    Chronic, stable.  Diagnosed by Dr. Olen Pel with psychiatry in 2022.  Tolerating medication well with good symptom control, continue this.  She is agreeable to every 3 months visits + UDS annually.  Obain UDS next 11/10/22 and substance agreement on file.  Refills sent x 3 sent and predated.  Return in 3 months for further refills.      Relevant Orders   CBC with Differential/Platelet   TSH   Anxiety - Primary    Chronic, ongoing.  Denies SI/HI.  She plans on restarting Celexa 20 MG at this time.  Is working with therapist and looking into Ketamine treatments.      Relevant Medications   citalopram (CELEXA) 20 MG tablet   Other Relevant Orders   CBC with Differential/Platelet   TSH   Vitamin D deficiency    Chronic, ongoing.  Noted past labs.  Recommend she start Vitamin D3 2000 units daily and will adjust as needed.  Labs today.      Relevant Orders   VITAMIN D 25 Hydroxy (Vit-D Deficiency, Fractures)   Other Visit Diagnoses     Encounter for lipid screening for cardiovascular disease       Lipid panel on labs today.   Relevant Orders   Comprehensive metabolic panel   Lipid Panel w/o Chol/HDL Ratio   Need for hepatitis C screening test       Hep C screen on labs today per guidelines for one time screening, discussed with patient.   Relevant Orders   Hepatitis C antibody   Encounter for screening for HIV       HIV screen on labs today per guidelines for one time screening, discussed with patient.   Relevant Orders   HIV Antibody (routine testing w rflx)   Encounter for annual physical exam       Annual physical today with labs and health maintenance reviewed, discussed with patient.   Relevant Orders   CBC with Differential/Platelet   TSH        Follow up plan: Return in about 3 months (around 09/10/2022) for ADHD -- may be virtual.   LABORATORY TESTING:  - Pap smear: not applicable -- history of hysterectomy  total  IMMUNIZATIONS:   - Tdap: Tetanus vaccination status reviewed: last tetanus booster within 10 years. - Influenza: Refused - Pneumovax: Not applicable - Prevnar: Not applicable - COVID: Refused - HPV: Not applicable - Shingrix vaccine: Not applicable  SCREENING: -Mammogram: Up to date 02/28/22 performed -  Colonoscopy: Not applicable  - Bone Density: Not applicable  -Hearing Test: Not applicable  -Spirometry: Not applicable   PATIENT COUNSELING:   Advised to take 1 mg of folate supplement per day if capable of pregnancy.   Sexuality: Discussed sexually transmitted diseases, partner selection, use of condoms, avoidance of unintended pregnancy  and contraceptive alternatives.   Advised to avoid cigarette smoking.  I discussed with the patient that most people either abstain from alcohol or drink within safe limits (<=14/week and <=4 drinks/occasion for males, <=7/weeks and <= 3 drinks/occasion for females) and that the risk for alcohol disorders and other health effects rises proportionally with the number of drinks per week and how often a drinker exceeds daily limits.  Discussed cessation/primary prevention of drug use and availability of treatment for abuse.   Diet: Encouraged to adjust caloric intake to maintain  or achieve ideal body weight, to reduce intake of dietary saturated fat and total fat, to limit sodium intake by avoiding high sodium foods and not adding table salt, and to maintain adequate dietary potassium and calcium preferably from fresh fruits, vegetables, and low-fat dairy products.    Stressed the importance of regular exercise  Injury prevention: Discussed safety belts, safety helmets, smoke detector, smoking near bedding or upholstery.   Dental health: Discussed importance of regular tooth brushing, flossing, and dental visits.    NEXT PREVENTATIVE PHYSICAL DUE IN 1 YEAR. Return in about 3 months (around 09/10/2022) for ADHD -- may be  virtual.

## 2022-06-11 ENCOUNTER — Encounter: Payer: Self-pay | Admitting: Nurse Practitioner

## 2022-06-11 LAB — CBC WITH DIFFERENTIAL/PLATELET
Basophils Absolute: 0.1 10*3/uL (ref 0.0–0.2)
Basos: 1 %
EOS (ABSOLUTE): 0.1 10*3/uL (ref 0.0–0.4)
Eos: 1 %
Hematocrit: 38.5 % (ref 34.0–46.6)
Hemoglobin: 13.3 g/dL (ref 11.1–15.9)
Immature Grans (Abs): 0 10*3/uL (ref 0.0–0.1)
Immature Granulocytes: 0 %
Lymphocytes Absolute: 3.2 10*3/uL — ABNORMAL HIGH (ref 0.7–3.1)
Lymphs: 34 %
MCH: 30 pg (ref 26.6–33.0)
MCHC: 34.5 g/dL (ref 31.5–35.7)
MCV: 87 fL (ref 79–97)
Monocytes Absolute: 0.6 10*3/uL (ref 0.1–0.9)
Monocytes: 7 %
Neutrophils Absolute: 5.4 10*3/uL (ref 1.4–7.0)
Neutrophils: 57 %
Platelets: 247 10*3/uL (ref 150–450)
RBC: 4.44 x10E6/uL (ref 3.77–5.28)
RDW: 11.9 % (ref 11.7–15.4)
WBC: 9.4 10*3/uL (ref 3.4–10.8)

## 2022-06-11 LAB — COMPREHENSIVE METABOLIC PANEL
ALT: 13 IU/L (ref 0–32)
AST: 16 IU/L (ref 0–40)
Albumin/Globulin Ratio: 2.5 — ABNORMAL HIGH (ref 1.2–2.2)
Albumin: 4.5 g/dL (ref 3.9–4.9)
Alkaline Phosphatase: 60 IU/L (ref 44–121)
BUN/Creatinine Ratio: 14 (ref 9–23)
BUN: 10 mg/dL (ref 6–24)
Bilirubin Total: 0.3 mg/dL (ref 0.0–1.2)
CO2: 23 mmol/L (ref 20–29)
Calcium: 8.8 mg/dL (ref 8.7–10.2)
Chloride: 102 mmol/L (ref 96–106)
Creatinine, Ser: 0.71 mg/dL (ref 0.57–1.00)
Globulin, Total: 1.8 g/dL (ref 1.5–4.5)
Glucose: 93 mg/dL (ref 70–99)
Potassium: 4.3 mmol/L (ref 3.5–5.2)
Sodium: 140 mmol/L (ref 134–144)
Total Protein: 6.3 g/dL (ref 6.0–8.5)
eGFR: 107 mL/min/{1.73_m2} (ref >59)

## 2022-06-11 LAB — HEPATITIS C ANTIBODY: Hep C Virus Ab: NONREACTIVE

## 2022-06-11 LAB — HIV ANTIBODY (ROUTINE TESTING W REFLEX): HIV Screen 4th Generation wRfx: NONREACTIVE

## 2022-06-11 LAB — LIPID PANEL W/O CHOL/HDL RATIO
Cholesterol, Total: 198 mg/dL (ref 100–199)
HDL: 89 mg/dL (ref >39)
LDL Chol Calc (NIH): 93 mg/dL (ref 0–99)
Triglycerides: 88 mg/dL (ref 0–149)
VLDL Cholesterol Cal: 16 mg/dL (ref 5–40)

## 2022-06-11 LAB — TSH: TSH: 0.942 u[IU]/mL (ref 0.450–4.500)

## 2022-06-11 LAB — VITAMIN D 25 HYDROXY (VIT D DEFICIENCY, FRACTURES): Vit D, 25-Hydroxy: 17.2 ng/mL — ABNORMAL LOW (ref 30.0–100.0)

## 2022-06-11 NOTE — Progress Notes (Signed)
Contacted via [place] morning Lisa Baird, your labs have returned and overall they look fabulous with exception of Vitamin D being a bit low.  Please start taking Vitamin D3 2000 units daily for overall bone health.  Any questions? Keep being awesome!!  Thank you for allowing me to participate in your care.  I appreciate you. Kindest regards, Harris Penton

## 2022-06-14 DIAGNOSIS — F332 Major depressive disorder, recurrent severe without psychotic features: Secondary | ICD-10-CM | POA: Diagnosis not present

## 2022-06-15 DIAGNOSIS — F411 Generalized anxiety disorder: Secondary | ICD-10-CM | POA: Diagnosis not present

## 2022-06-18 DIAGNOSIS — F332 Major depressive disorder, recurrent severe without psychotic features: Secondary | ICD-10-CM | POA: Diagnosis not present

## 2022-06-21 DIAGNOSIS — F332 Major depressive disorder, recurrent severe without psychotic features: Secondary | ICD-10-CM | POA: Diagnosis not present

## 2022-06-25 DIAGNOSIS — F332 Major depressive disorder, recurrent severe without psychotic features: Secondary | ICD-10-CM | POA: Diagnosis not present

## 2022-06-28 DIAGNOSIS — F411 Generalized anxiety disorder: Secondary | ICD-10-CM | POA: Diagnosis not present

## 2022-06-29 ENCOUNTER — Other Ambulatory Visit: Payer: Self-pay | Admitting: Nurse Practitioner

## 2022-07-01 ENCOUNTER — Other Ambulatory Visit: Payer: Self-pay

## 2022-07-01 DIAGNOSIS — F411 Generalized anxiety disorder: Secondary | ICD-10-CM | POA: Diagnosis not present

## 2022-07-01 MED ORDER — CITALOPRAM HYDROBROMIDE 20 MG PO TABS
20.0000 mg | ORAL_TABLET | Freq: Every day | ORAL | 4 refills | Status: AC
  Filled 2022-07-01 – 2022-11-12 (×2): qty 90, 90d supply, fill #0

## 2022-07-02 DIAGNOSIS — F332 Major depressive disorder, recurrent severe without psychotic features: Secondary | ICD-10-CM | POA: Diagnosis not present

## 2022-07-02 DIAGNOSIS — F411 Generalized anxiety disorder: Secondary | ICD-10-CM | POA: Diagnosis not present

## 2022-07-09 DIAGNOSIS — F411 Generalized anxiety disorder: Secondary | ICD-10-CM | POA: Diagnosis not present

## 2022-07-09 DIAGNOSIS — F332 Major depressive disorder, recurrent severe without psychotic features: Secondary | ICD-10-CM | POA: Diagnosis not present

## 2022-07-11 DIAGNOSIS — F411 Generalized anxiety disorder: Secondary | ICD-10-CM | POA: Diagnosis not present

## 2022-07-14 ENCOUNTER — Other Ambulatory Visit: Payer: Self-pay

## 2022-07-16 DIAGNOSIS — F332 Major depressive disorder, recurrent severe without psychotic features: Secondary | ICD-10-CM | POA: Diagnosis not present

## 2022-07-16 DIAGNOSIS — F411 Generalized anxiety disorder: Secondary | ICD-10-CM | POA: Diagnosis not present

## 2022-09-07 NOTE — Patient Instructions (Signed)
Attention Deficit Hyperactivity Disorder, Adult Attention deficit hyperactivity disorder (ADHD) is a mental health disorder that starts during childhood. For many people with ADHD, the disorder continues into the adult years. Treatment can help you manage your symptoms. There are three main types of ADHD: Inattentive. With this type, adults have difficulty paying attention. This may affect cognitive abilities. Hyperactive-impulsive. With this type, adults have a lot of energy and have difficulty controlling their behavior. Combination type. Some people may have symptoms of both types. What are the causes? The exact cause of ADHD is not known. Most experts believe a person's genes and environment possibly contribute to ADHD. What increases the risk? The following factors may make you more likely to develop this condition: Having a first-degree relative such as a parent, brother, or sister, with the condition. Being born before 37 weeks of pregnancy (prematurely) or at a low birth weight. Being born to a mother who smoked tobacco or drank alcohol during pregnancy. Having experienced a brain injury. Being exposed to lead or other toxins in the womb or early in life. What are the signs or symptoms? Symptoms of this condition depend on the type of ADHD. Symptoms of the inattentive type include: Difficulty paying attention or following instructions. Often making simple mistakes. Being disorganized. Avoiding tasks that require time and attention. Losing and forgetting things. Symptoms of the hyperactive-impulsive type include: Restlessness. Talking out of turn, interrupting others, or talking too much. Difficulty with: Sitting still. Feeling motivated. Relaxing. Waiting in line or waiting for a turn. People with the combination type have symptoms of both of the other types. In adults, this condition may lead to certain problems, such as: Keeping jobs. Performing tasks at work. Having  stable relationships. Being on time or keeping to a schedule. How is this diagnosed? This condition is diagnosed based on your current symptoms and your history of symptoms. The diagnosis can be made by a health care provider such as a primary care provider or a mental health care specialist. Your health care provider may use a symptom checklist or a behavior rating scale to evaluate your symptoms. Your health care provider may also want to talk with people who have observed your behaviors throughout your life. How is this treated? This condition can be treated with medicines and behavior therapy. Medicines may be the best option to reduce impulsive behaviors and improve attention. Your health care provider may recommend: Stimulant medicines. These are the most common medicines used for adult ADHD. They affect certain chemicals in the brain (neurotransmitters) and improve your ability to control your symptoms. A non-stimulant medicine. These medicines can also improve focus, attention, and impulsive behavior. It may take weeks to months to see the effects of this medicine. Counseling and behavioral management are also important for treating ADHD. Counseling is often used along with medicine. Your health care provider may suggest: Cognitive behavioral therapy (CBT). This type of therapy teaches you to replace negative thoughts and actions with positive thoughts and actions. When used as part of ADHD treatment, this therapy may also include: Coping strategies for organization, time management, impulse control, and stress reduction. Mindfulness and meditation training. Behavioral management. You may work with a coach who is specially trained to help people with ADHD manage and organize activities and function more effectively. Follow these instructions at home: Medicines  Take over-the-counter and prescription medicines only as told by your health care provider. Talk with your health care provider  about the possible side effects of your medicines and   how to manage them. Alcohol use Do not drink alcohol if: Your health care provider tells you not to drink. You are pregnant, may be pregnant, or are planning to become pregnant. If you drink alcohol: Limit how much you use to: 0-1 drink a day for women. 0-2 drinks a day for men. Know how much alcohol is in your drink. In the [place] one drink equals one 12 oz bottle of beer (355 mL), one 5 oz glass of wine (148 mL), or one 1 oz glass of hard liquor (44 mL). Lifestyle  Do not use illegal drugs. Get enough sleep. Eat a healthy diet. Exercise regularly. Exercise can help to reduce stress and anxiety. General instructions Learn as much as you can about adult ADHD, and work closely with your health care providers to find the treatments that work best for you. Follow the same schedule each day. Use reminder devices like notes, calendars, and phone apps to stay on time and organized. Keep all follow-up visits. Your health care provider will need to monitor your condition and adjust your treatment over time. Where to find more information A health care provider may be able to recommend resources that are available online or over the phone. You could start with: Attention Deficit Disorder Association (ADDA): add.org National Institute of Mental Health (NIMH): nimh.nih.gov Contact a health care provider if: Your symptoms continue to cause problems. You have side effects from your medicine, such as: Repeated muscle twitches, coughing, or speech outbursts. Sleep problems. Loss of appetite. Dizziness. Unusually fast heartbeat. Stomach pains. Headaches. You are struggling with anxiety, depression, or substance abuse. Get help right away if: You have a severe reaction to a medicine. This symptom may be an emergency. Get help right away. Call 911. Do not wait to see if the symptom will go away. Do not drive yourself to the hospital. Take  one of these steps if you feel like you may hurt yourself or others, or have thoughts about taking your own life: Go to your nearest emergency room. Call 911. Call the National Suicide Prevention Lifeline at 1-800-273-8255 or 988. This is open 24 hours a day Text the Crisis Text Line at 741741. Summary ADHD is a mental health disorder that starts during childhood and often continues into your adult years. The exact cause of ADHD is not known. Most experts believe genetics and environmental factors contribute to ADHD. There is no cure for ADHD, but treatment with medicine, cognitive behavioral therapy, or behavioral management can help you manage your condition. This information is not intended to replace advice given to you by your health care provider. Make sure you discuss any questions you have with your health care provider. Document Revised: 10/19/2021 Document Reviewed: 10/19/2021 Elsevier Patient Education  2023 [employer].  

## 2022-09-10 ENCOUNTER — Encounter: Payer: Self-pay | Admitting: Nurse Practitioner

## 2022-09-10 ENCOUNTER — Telehealth (INDEPENDENT_AMBULATORY_CARE_PROVIDER_SITE_OTHER): Payer: BC Managed Care – PPO | Admitting: Nurse Practitioner

## 2022-09-10 DIAGNOSIS — F902 Attention-deficit hyperactivity disorder, combined type: Secondary | ICD-10-CM

## 2022-09-10 MED ORDER — AMPHETAMINE-DEXTROAMPHETAMINE 20 MG PO TABS: 20.0000 mg | ORAL_TABLET | Freq: Two times a day (BID) | ORAL | 0 refills | Status: DC

## 2022-09-10 NOTE — Progress Notes (Signed)
Called to schedule, left detail message and to call office back.

## 2022-09-10 NOTE — Progress Notes (Signed)
LMP 09/21/2020 (Approximate)    Subjective:    Patient ID: Lisa Baird, female    DOB: July 09, 1976, 47 y.o.   MRN: [id number]  HPI: Lisa Baird is a 47 y.o. female  Chief Complaint  Patient presents with   ADHD   This visit was completed via video visit through MyChart due to the restrictions of the COVID-19 pandemic. All issues as above were discussed and addressed. Physical exam was done as above through visual confirmation on video through MyChart. If it was felt that the patient should be evaluated in the office, they were directed there. The patient verbally consented to this visit. Location of the patient: home Location of the provider: work Those involved with this call:  Provider: Marnee Guarneri, DNP CMA: Frazier Butt, [place] Desk/Registration: [employer]  Time spent on call:  21 minutes with patient face to face via video conference. More than 50% of this time was spent in counseling and coordination of care. 15 minutes total spent in review of patient's record and preparation of their chart.  I verified patient identity using two factors (patient name and date of birth). Patient consents verbally to being seen via telemedicine visit today.    ADHD FOLLOW UP Continues on Adderall daily, last 08/13/22.  She is taking Celexa 20 MG daily for anxiety.   ADHD status: controlled Satisfied with current therapy: yes Medication compliance:  good compliance Controlled substance contract: yes Previous psychiatry evaluation: no Previous medications: none Taking meds on weekends/vacations: yes Work/school performance:  good Difficulty sustaining attention/completing tasks: no Distracted by extraneous stimuli: no Does not listen when spoken to: no  Fidgets with hands or feet: yes Unable to stay in seat: no Blurts out/interrupts others: no ADHD Medication Side Effects: no    Decreased appetite: no    Headache: no    Sleeping disturbance pattern: no    Irritability:  no    Rebound effects (worse than baseline) off medication: no    Anxiousness: no    Dizziness: no    Tics: no   Relevant past medical, surgical, family and social history reviewed and updated as indicated. Interim medical history since our last visit reviewed. Allergies and medications reviewed and updated.  Review of Systems  Constitutional:  Negative for activity change, appetite change, diaphoresis, fatigue and fever.  Respiratory:  Negative for cough, chest tightness and shortness of breath.   Cardiovascular:  Negative for chest pain, palpitations and leg swelling.  Gastrointestinal: Negative.   Neurological: Negative.   Psychiatric/Behavioral:  Negative for decreased concentration, self-injury, sleep disturbance and suicidal ideas. The patient is not nervous/anxious.     Per HPI unless specifically indicated above     Objective:    LMP 09/21/2020 (Approximate)   Wt Readings from Last 3 Encounters:  06/10/22 132 lb 3.2 oz (60 kg)  05/02/22 132 lb (59.9 kg)  03/19/22 128 lb 15.5 oz (58.5 kg)    Physical Exam Vitals and nursing note reviewed.  Constitutional:      General: She is awake. She is not in acute distress.    Appearance: She is well-developed. She is not ill-appearing.  HENT:     Head: Normocephalic.     Right Ear: Hearing normal.     Left Ear: Hearing normal.  Eyes:     General: Lids are normal.        Right eye: No discharge.        Left eye: No discharge.     Conjunctiva/sclera:  Conjunctivae normal.  Pulmonary:     Effort: Pulmonary effort is normal. No accessory muscle usage or respiratory distress.  Musculoskeletal:     Cervical back: Normal range of motion.  Neurological:     Mental Status: She is alert and oriented to person, place, and time.  Psychiatric:        Attention and Perception: Attention normal.        Mood and Affect: Mood normal.        Behavior: Behavior normal. Behavior is cooperative.        Thought Content: Thought content  normal.        Judgment: Judgment normal.    Results for orders placed or performed in visit on 06/10/22  CBC with Differential/Platelet  Result Value Ref Range   WBC 9.4 3.4 - 10.8 x10E3/uL   RBC 4.44 3.77 - 5.28 x10E6/uL   Hemoglobin 13.3 11.1 - 15.9 g/dL   Hematocrit 38.5 34.0 - 46.6 %   MCV 87 79 - 97 fL   MCH 30.0 26.6 - 33.0 pg   MCHC 34.5 31.5 - 35.7 g/dL   RDW 11.9 11.7 - 15.4 %   Platelets 247 150 - 450 x10E3/uL   Neutrophils 57 Not Estab. %   Lymphs 34 Not Estab. %   Monocytes 7 Not Estab. %   Eos 1 Not Estab. %   Basos 1 Not Estab. %   Neutrophils Absolute 5.4 1.4 - 7.0 x10E3/uL   Lymphocytes Absolute 3.2 (H) 0.7 - 3.1 x10E3/uL   Monocytes Absolute 0.6 0.1 - 0.9 x10E3/uL   EOS (ABSOLUTE) 0.1 0.0 - 0.4 x10E3/uL   Basophils Absolute 0.1 0.0 - 0.2 x10E3/uL   Immature Granulocytes 0 Not Estab. %   Immature Grans (Abs) 0.0 0.0 - 0.1 x10E3/uL  Comprehensive metabolic panel  Result Value Ref Range   Glucose 93 70 - 99 mg/dL   BUN 10 6 - 24 mg/dL   Creatinine, Ser 0.71 0.57 - 1.00 mg/dL   eGFR 107 >59 mL/min/1.73   BUN/Creatinine Ratio 14 9 - 23   Sodium 140 134 - 144 mmol/L   Potassium 4.3 3.5 - 5.2 mmol/L   Chloride 102 96 - 106 mmol/L   CO2 23 20 - 29 mmol/L   Calcium 8.8 8.7 - 10.2 mg/dL   Total Protein 6.3 6.0 - 8.5 g/dL   Albumin 4.5 3.9 - 4.9 g/dL   Globulin, Total 1.8 1.5 - 4.5 g/dL   Albumin/Globulin Ratio 2.5 (H) 1.2 - 2.2   Bilirubin Total 0.3 0.0 - 1.2 mg/dL   Alkaline Phosphatase 60 44 - 121 IU/L   AST 16 0 - 40 IU/L   ALT 13 0 - 32 IU/L  Lipid Panel w/o Chol/HDL Ratio  Result Value Ref Range   Cholesterol, Total 198 100 - 199 mg/dL   Triglycerides 88 0 - 149 mg/dL   HDL 89 >39 mg/dL   VLDL Cholesterol Cal 16 5 - 40 mg/dL   LDL Chol Calc (NIH) 93 0 - 99 mg/dL  TSH  Result Value Ref Range   TSH 0.942 0.450 - 4.500 uIU/mL  VITAMIN D 25 Hydroxy (Vit-D Deficiency, Fractures)  Result Value Ref Range   Vit D, 25-Hydroxy 17.2 (L) 30.0 - 100.0 ng/mL   Hepatitis C antibody  Result Value Ref Range   Hep C Virus Ab Non Reactive Non Reactive  HIV Antibody (routine testing w rflx)  Result Value Ref Range   HIV Screen 4th Generation wRfx Non Reactive Non Reactive  Assessment & Plan:   Problem List Items Addressed This Visit       Other   ADHD - Primary    Chronic, stable.  Diagnosed by Dr. Olen Pel with psychiatry in 2022.  Tolerating medication well with good symptom control, continue this.  She is agreeable to every 3 months visits + UDS annually.  Obain UDS next 11/10/22 and substance agreement on file.  Refills sent x 3 sent and predated.  Return in 3 months for further refills.       I discussed the assessment and treatment plan with the patient. The patient was provided an opportunity to ask questions and all were answered. The patient agreed with the plan and demonstrated an understanding of the instructions.   The patient was advised to call back or seek an in-person evaluation if the symptoms worsen or if the condition fails to improve as anticipated.   I provided 21+ minutes of time during this encounter.    Follow up plan: Return in about 3 months (around 12/09/2022) for ADHD.

## 2022-09-10 NOTE — Assessment & Plan Note (Signed)
Chronic, stable.  Diagnosed by Dr. Olen Pel with psychiatry in 2022.  Tolerating medication well with good symptom control, continue this.  She is agreeable to every 3 months visits + UDS annually.  Obain UDS next 11/10/22 and substance agreement on file.  Refills sent x 3 sent and predated.  Return in 3 months for further refills.

## 2022-09-11 ENCOUNTER — Encounter: Payer: Self-pay | Admitting: Nurse Practitioner

## 2022-09-11 NOTE — Progress Notes (Signed)
Called pt again and left message to get scheduled for 3 month follow up for ADHD

## 2022-09-11 NOTE — Progress Notes (Signed)
Tried to call several times made an appointment for Dec 10, 2022 @ 2:00 pm.  Also sent letter out stating this.

## 2022-10-07 ENCOUNTER — Encounter: Payer: Self-pay | Admitting: Nurse Practitioner

## 2022-10-08 NOTE — Telephone Encounter (Signed)
LVM to call office to get scheduled regarding patients feet.

## 2022-10-10 ENCOUNTER — Ambulatory Visit: Payer: BC Managed Care – PPO | Admitting: Physician Assistant

## 2022-11-12 ENCOUNTER — Other Ambulatory Visit: Payer: Self-pay

## 2022-11-13 ENCOUNTER — Other Ambulatory Visit: Payer: Self-pay

## 2022-12-08 NOTE — Patient Instructions (Signed)
Blue Lizard for sensitive skin sunscreen and Eucerin has a sensitive skin sun screen  Attention Deficit Hyperactivity Disorder, Adult Attention deficit hyperactivity disorder (ADHD) is a mental health disorder that starts during childhood. For many people with ADHD, the disorder continues into the adult years. Treatment can help you manage your symptoms. There are three main types of ADHD: Inattentive. With this type, adults have difficulty paying attention. This may affect cognitive abilities. Hyperactive-impulsive. With this type, adults have a lot of energy and have difficulty controlling their behavior. Combination type. Some people may have symptoms of both types. What are the causes? The exact cause of ADHD is not known. Most experts believe a person's genes and environment possibly contribute to ADHD. What increases the risk? The following factors may make you more likely to develop this condition: Having a first-degree relative such as a parent, brother, or sister, with the condition. Being born before 37 weeks of pregnancy (prematurely) or at a low birth weight. Being born to a mother who smoked tobacco or drank alcohol during pregnancy. Having experienced a brain injury. Being exposed to lead or other toxins in the womb or early in life. What are the signs or symptoms? Symptoms of this condition depend on the type of ADHD. Symptoms of the inattentive type include: Difficulty paying attention or following instructions. Often making simple mistakes. Being disorganized. Avoiding tasks that require time and attention. Losing and forgetting things. Symptoms of the hyperactive-impulsive type include: Restlessness. Talking out of turn, interrupting others, or talking too much. Difficulty with: Sitting still. Feeling motivated. Relaxing. Waiting in line or waiting for a turn. People with the combination type have symptoms of both of the other types. In adults, this condition may  lead to certain problems, such as: Keeping jobs. Performing tasks at work. Having stable relationships. Being on time or keeping to a schedule. How is this diagnosed? This condition is diagnosed based on your current symptoms and your history of symptoms. The diagnosis can be made by a health care provider such as a primary care provider or a mental health care specialist. Your health care provider may use a symptom checklist or a behavior rating scale to evaluate your symptoms. Your health care provider may also want to talk with people who have observed your behaviors throughout your life. How is this treated? This condition can be treated with medicines and behavior therapy. Medicines may be the best option to reduce impulsive behaviors and improve attention. Your health care provider may recommend: Stimulant medicines. These are the most common medicines used for adult ADHD. They affect certain chemicals in the brain (neurotransmitters) and improve your ability to control your symptoms. A non-stimulant medicine. These medicines can also improve focus, attention, and impulsive behavior. It may take weeks to months to see the effects of this medicine. Counseling and behavioral management are also important for treating ADHD. Counseling is often used along with medicine. Your health care provider may suggest: Cognitive behavioral therapy (CBT). This type of therapy teaches you to replace negative thoughts and actions with positive thoughts and actions. When used as part of ADHD treatment, this therapy may also include: Coping strategies for organization, time management, impulse control, and stress reduction. Mindfulness and meditation training. Behavioral management. You may work with a coach who is specially trained to help people with ADHD manage and organize activities and function more effectively. Follow these instructions at home: Medicines  Take over-the-counter and prescription  medicines only as told by your health care provider.   Talk with your health care provider about the possible side effects of your medicines and how to manage them. Alcohol use Do not drink alcohol if: Your health care provider tells you not to drink. You are pregnant, may be pregnant, or are planning to become pregnant. If you drink alcohol: Limit how much you use to: 0-1 drink a day for women. 0-2 drinks a day for men. Know how much alcohol is in your drink. In the [place] one drink equals one 12 oz bottle of beer (355 mL), one 5 oz glass of wine (148 mL), or one 1 oz glass of hard liquor (44 mL). Lifestyle  Do not use illegal drugs. Get enough sleep. Eat a healthy diet. Exercise regularly. Exercise can help to reduce stress and anxiety. General instructions Learn as much as you can about adult ADHD, and work closely with your health care providers to find the treatments that work best for you. Follow the same schedule each day. Use reminder devices like notes, calendars, and phone apps to stay on time and organized. Keep all follow-up visits. Your health care provider will need to monitor your condition and adjust your treatment over time. Where to find more information A health care provider may be able to recommend resources that are available online or over the phone. You could start with: Attention Deficit Disorder Association (ADDA): add.org National Institute of Mental Health (NIMH): nimh.nih.gov Contact a health care provider if: Your symptoms continue to cause problems. You have side effects from your medicine, such as: Repeated muscle twitches, coughing, or speech outbursts. Sleep problems. Loss of appetite. Dizziness. Unusually fast heartbeat. Stomach pains. Headaches. You are struggling with anxiety, depression, or substance abuse. Get help right away if: You have a severe reaction to a medicine. This symptom may be an emergency. Get help right away. Call 911. Do  not wait to see if the symptom will go away. Do not drive yourself to the hospital. Take one of these steps if you feel like you may hurt yourself or others, or have thoughts about taking your own life: Go to your nearest emergency room. Call 911. Call the National Suicide Prevention Lifeline at 1-800-273-8255 or 988. This is open 24 hours a day Text the Crisis Text Line at 741741. Summary ADHD is a mental health disorder that starts during childhood and often continues into your adult years. The exact cause of ADHD is not known. Most experts believe genetics and environmental factors contribute to ADHD. There is no cure for ADHD, but treatment with medicine, cognitive behavioral therapy, or behavioral management can help you manage your condition. This information is not intended to replace advice given to you by your health care provider. Make sure you discuss any questions you have with your health care provider. Document Revised: 10/19/2021 Document Reviewed: 10/19/2021 Elsevier Patient Education  2024 Elsevier Inc.  

## 2022-12-10 ENCOUNTER — Encounter: Payer: Self-pay | Admitting: Nurse Practitioner

## 2022-12-10 ENCOUNTER — Telehealth (INDEPENDENT_AMBULATORY_CARE_PROVIDER_SITE_OTHER): Payer: Medicaid Other | Admitting: Nurse Practitioner

## 2022-12-10 DIAGNOSIS — F902 Attention-deficit hyperactivity disorder, combined type: Secondary | ICD-10-CM | POA: Diagnosis not present

## 2022-12-10 DIAGNOSIS — F419 Anxiety disorder, unspecified: Secondary | ICD-10-CM

## 2022-12-10 MED ORDER — AMPHETAMINE-DEXTROAMPHETAMINE 20 MG PO TABS: 20.0000 mg | ORAL_TABLET | Freq: Two times a day (BID) | ORAL | 0 refills | Status: DC

## 2022-12-10 NOTE — Assessment & Plan Note (Signed)
Chronic, ongoing.  Denies SI/HI.  Continue Celexa 20 MG at this time.  Is working with therapist and looking into Ketamine treatments.

## 2022-12-10 NOTE — Assessment & Plan Note (Signed)
Chronic, stable.  Diagnosed by Dr. Judee Clara with psychiatry in 2022.  Tolerating medication well with good symptom control, continue this.  She is agreeable to every 3 months visits + UDS annually.  Obain UDS next 11/10/22 (will obtain outpatient or at next visit) and substance agreement on file.  Refills sent x 3 sent and predated.  Return in 3 months for further refills.

## 2022-12-10 NOTE — Progress Notes (Signed)
LMP 09/21/2020 (Approximate)    Subjective:    Patient ID: Lisa Baird, female    DOB: 1975-07-16, 47 y.o.   MRN: [id number]  HPI: AMIAYA Baird is a 47 y.o. female  Chief Complaint  Patient presents with   ADHD   This visit was completed via video visit through MyChart due to the restrictions of the COVID-19 pandemic. All issues as above were discussed and addressed. Physical exam was done as above through visual confirmation on video through MyChart. If it was felt that the patient should be evaluated in the office, they were directed there. The patient verbally consented to this visit. Location of the patient: home Location of the provider: work Those involved with this call:  Provider: Aura Dials, DNP CMA: Tristan Schroeder, CMA Front Desk/Registration: [employer]  Time spent on call:  21 minutes with patient face to face via video conference. More than 50% of this time was spent in counseling and coordination of care. 15 minutes total spent in review of patient's record and preparation of their chart.  I verified patient identity using two factors (patient name and date of birth). Patient consents verbally to being seen via telemedicine visit today.    ADHD FOLLOW UP Continues on Adderall 20 MG BID, last fill 11/13/22.  She is taking Celexa 20 MG daily for anxiety, occasionally gets nauseous with this and takes at night.   ADHD status: controlled Satisfied with current therapy: yes Medication compliance:  good compliance Controlled substance contract: yes Previous psychiatry evaluation: no Previous medications: none Taking meds on weekends/vacations: yes Work/school performance:  good Difficulty sustaining attention/completing tasks: no Distracted by extraneous stimuli: no Does not listen when spoken to: no  Fidgets with hands or feet: yes Unable to stay in seat: no Blurts out/interrupts others: no ADHD Medication Side Effects: no    Decreased appetite: no     Headache: no    Sleeping disturbance pattern: no    Irritability: no    Rebound effects (worse than baseline) off medication: no    Anxiousness: no    Dizziness: no    Tics: no   Relevant past medical, surgical, family and social history reviewed and updated as indicated. Interim medical history since our last visit reviewed. Allergies and medications reviewed and updated.  Review of Systems  Constitutional:  Negative for activity change, appetite change, diaphoresis, fatigue and fever.  Respiratory:  Negative for cough, chest tightness and shortness of breath.   Cardiovascular:  Negative for chest pain, palpitations and leg swelling.  Gastrointestinal: Negative.   Neurological: Negative.   Psychiatric/Behavioral:  Negative for decreased concentration, self-injury, sleep disturbance and suicidal ideas. The patient is not nervous/anxious.     Per HPI unless specifically indicated above     Objective:    LMP 09/21/2020 (Approximate)   Wt Readings from Last 3 Encounters:  06/10/22 132 lb 3.2 oz (60 kg)  05/02/22 132 lb (59.9 kg)  03/19/22 128 lb 15.5 oz (58.5 kg)    Physical Exam Vitals and nursing note reviewed.  Constitutional:      General: She is awake. She is not in acute distress.    Appearance: She is well-developed. She is not ill-appearing.  HENT:     Head: Normocephalic.     Right Ear: Hearing normal.     Left Ear: Hearing normal.  Eyes:     General: Lids are normal.        Right eye: No discharge.  Left eye: No discharge.     Conjunctiva/sclera: Conjunctivae normal.  Pulmonary:     Effort: Pulmonary effort is normal. No accessory muscle usage or respiratory distress.  Musculoskeletal:     Cervical back: Normal range of motion.  Neurological:     Mental Status: She is alert and oriented to person, place, and time.  Psychiatric:        Attention and Perception: Attention normal.        Mood and Affect: Mood normal.        Behavior: Behavior normal.  Behavior is cooperative.        Thought Content: Thought content normal.        Judgment: Judgment normal.    Results for orders placed or performed in visit on 06/10/22  CBC with Differential/Platelet  Result Value Ref Range   WBC 9.4 3.4 - 10.8 x10E3/uL   RBC 4.44 3.77 - 5.28 x10E6/uL   Hemoglobin 13.3 11.1 - 15.9 g/dL   Hematocrit 13.0 86.5 - 46.6 %   MCV 87 79 - 97 fL   MCH 30.0 26.6 - 33.0 pg   MCHC 34.5 31.5 - 35.7 g/dL   RDW 78.4 69.6 - 29.5 %   Platelets 247 150 - 450 x10E3/uL   Neutrophils 57 Not Estab. %   Lymphs 34 Not Estab. %   Monocytes 7 Not Estab. %   Eos 1 Not Estab. %   Basos 1 Not Estab. %   Neutrophils Absolute 5.4 1.4 - 7.0 x10E3/uL   Lymphocytes Absolute 3.2 (H) 0.7 - 3.1 x10E3/uL   Monocytes Absolute 0.6 0.1 - 0.9 x10E3/uL   EOS (ABSOLUTE) 0.1 0.0 - 0.4 x10E3/uL   Basophils Absolute 0.1 0.0 - 0.2 x10E3/uL   Immature Granulocytes 0 Not Estab. %   Immature Grans (Abs) 0.0 0.0 - 0.1 x10E3/uL  Comprehensive metabolic panel  Result Value Ref Range   Glucose 93 70 - 99 mg/dL   BUN 10 6 - 24 mg/dL   Creatinine, Ser 2.84 0.57 - 1.00 mg/dL   eGFR 132 >44 WN/UUV/2.53   BUN/Creatinine Ratio 14 9 - 23   Sodium 140 134 - 144 mmol/L   Potassium 4.3 3.5 - 5.2 mmol/L   Chloride 102 96 - 106 mmol/L   CO2 23 20 - 29 mmol/L   Calcium 8.8 8.7 - 10.2 mg/dL   Total Protein 6.3 6.0 - 8.5 g/dL   Albumin 4.5 3.9 - 4.9 g/dL   Globulin, Total 1.8 1.5 - 4.5 g/dL   Albumin/Globulin Ratio 2.5 (H) 1.2 - 2.2   Bilirubin Total 0.3 0.0 - 1.2 mg/dL   Alkaline Phosphatase 60 44 - 121 IU/L   AST 16 0 - 40 IU/L   ALT 13 0 - 32 IU/L  Lipid Panel w/o Chol/HDL Ratio  Result Value Ref Range   Cholesterol, Total 198 100 - 199 mg/dL   Triglycerides 88 0 - 149 mg/dL   HDL 89 >66 mg/dL   VLDL Cholesterol Cal 16 5 - 40 mg/dL   LDL Chol Calc (NIH) 93 0 - 99 mg/dL  TSH  Result Value Ref Range   TSH 0.942 0.450 - 4.500 uIU/mL  VITAMIN D 25 Hydroxy (Vit-D Deficiency, Fractures)   Result Value Ref Range   Vit D, 25-Hydroxy 17.2 (L) 30.0 - 100.0 ng/mL  Hepatitis C antibody  Result Value Ref Range   Hep C Virus Ab Non Reactive Non Reactive  HIV Antibody (routine testing w rflx)  Result Value Ref Range   HIV  Screen 4th Generation wRfx Non Reactive Non Reactive      Assessment & Plan:   Problem List Items Addressed This Visit       Other   ADHD - Primary    Chronic, stable.  Diagnosed by Dr. Judee Clara with psychiatry in 2022.  Tolerating medication well with good symptom control, continue this.  She is agreeable to every 3 months visits + UDS annually.  Obain UDS next 11/10/22 (will obtain outpatient or at next visit) and substance agreement on file.  Refills sent x 3 sent and predated.  Return in 3 months for further refills.      Anxiety    Chronic, ongoing.  Denies SI/HI.  Continue Celexa 20 MG at this time.  Is working with therapist and looking into Ketamine treatments.       I discussed the assessment and treatment plan with the patient. The patient was provided an opportunity to ask questions and all were answered. The patient agreed with the plan and demonstrated an understanding of the instructions.   The patient was advised to call back or seek an in-person evaluation if the symptoms worsen or if the condition fails to improve as anticipated.   I provided 21+ minutes of time during this encounter.    Follow up plan: Return in about 3 months (around 03/12/2023) for ADHD and Anxiety.

## 2022-12-10 NOTE — Progress Notes (Signed)
Appointment has been scheduled.

## 2023-03-12 ENCOUNTER — Ambulatory Visit: Payer: Medicaid Other | Admitting: Nurse Practitioner

## 2023-03-12 ENCOUNTER — Encounter: Payer: Self-pay | Admitting: Nurse Practitioner

## 2023-03-12 ENCOUNTER — Telehealth (INDEPENDENT_AMBULATORY_CARE_PROVIDER_SITE_OTHER): Payer: Medicaid Other | Admitting: Nurse Practitioner

## 2023-03-12 DIAGNOSIS — F419 Anxiety disorder, unspecified: Secondary | ICD-10-CM | POA: Diagnosis not present

## 2023-03-12 DIAGNOSIS — F902 Attention-deficit hyperactivity disorder, combined type: Secondary | ICD-10-CM

## 2023-03-12 MED ORDER — AMPHETAMINE-DEXTROAMPHETAMINE 20 MG PO TABS: 20.0000 mg | ORAL_TABLET | Freq: Two times a day (BID) | ORAL | 0 refills | Status: DC

## 2023-03-12 MED ORDER — KETOCONAZOLE 2 % EX CREA: TOPICAL_CREAM | Freq: Every day | CUTANEOUS | 4 refills | Status: AC

## 2023-03-12 NOTE — Patient Instructions (Signed)
Managing Anxiety, Adult After being diagnosed with anxiety, you may be relieved to know why you have felt or behaved a certain way. You may also feel overwhelmed about the treatment ahead and what it will mean for your life. With care and support, you can manage your anxiety. How to manage lifestyle changes Understanding the difference between stress and anxiety Although stress can play a role in anxiety, it is not the same as anxiety. Stress is your body's reaction to life changes and events, both good and bad. Stress is often caused by something external, such as a deadline, test, or competition. It normally goes away after the event has ended and will last just a few hours. But, stress can be ongoing and can lead to more than just stress. Anxiety is caused by something internal, such as imagining a terrible outcome or worrying that something will go wrong that will greatly upset you. Anxiety often does not go away even after the event is over, and it can become a long-term (chronic) worry. Lowering stress and anxiety Talk with your health care provider or a counselor to learn more about lowering anxiety and stress. They may suggest tension-reduction techniques, such as: Music. Spend time creating or listening to music that you enjoy and that inspires you. Mindfulness-based meditation. Practice being aware of your normal breaths while not trying to control your breathing. It can be done while sitting or walking. Centering prayer. Focus on a word, phrase, or sacred image that means something to you and brings you peace. Deep breathing. Expand your stomach and inhale slowly through your nose. Hold your breath for 3-5 seconds. Then breathe out slowly, letting your stomach muscles relax. Self-talk. Learn to notice and spot thought patterns that lead to anxiety reactions. Change those patterns to thoughts that feel peaceful. Muscle relaxation. Take time to tense muscles and then relax them. Choose a  tension-reduction technique that fits your lifestyle and personality. These techniques take time and practice. Set aside 5-15 minutes a day to do them. Specialized therapists can offer counseling and training in these techniques. The training to help with anxiety may be covered by some insurance plans. Other things you can do to manage stress and anxiety include: Keeping a stress diary. This can help you learn what triggers your reaction and then learn ways to manage your response. Thinking about how you react to certain situations. You may not be able to control everything, but you can control your response. Making time for activities that help you relax and not feeling guilty about spending your time in this way. Doing visual imagery. This involves imagining or creating mental pictures to help you relax. Practicing yoga. Through yoga poses, you can lower tension and relax.  Medicines Medicines for anxiety include: Antidepressant medicines. These are usually prescribed for long-term daily control. Anti-anxiety medicines. These may be added in severe cases, especially when panic attacks occur. When used together, medicines, psychotherapy, and tension-reduction techniques may be the most effective treatment. Relationships Relationships can play a big part in helping you recover. Spend more time connecting with trusted friends and family members. Think about going to couples counseling if you have a partner, taking family education classes, or going to family therapy. Therapy can help you and others better understand your anxiety. How to recognize changes in your anxiety Everyone responds differently to treatment for anxiety. Recovery from anxiety happens when symptoms lessen and stop interfering with your daily life at home or work. This may mean that you   will start to: Have better concentration and focus. Worry will interfere less in your daily thinking. Sleep better. Be less irritable. Have more  energy. Have improved memory. Try to recognize when your condition is getting worse. Contact your provider if your symptoms interfere with home or work and you feel like your condition is not improving. Follow these instructions at home: Activity Exercise. Adults should: Exercise for at least 150 minutes each week. The exercise should increase your heart rate and make you sweat (moderate-intensity exercise). Do strengthening exercises at least twice a week. Get the right amount and quality of sleep. Most adults need 7-9 hours of sleep each night. Lifestyle  Eat a healthy diet that includes plenty of vegetables, fruits, whole grains, low-fat dairy products, and lean protein. Do not eat a lot of foods that are high in fats, added sugars, or salt (sodium). Make choices that simplify your life. Do not use any products that contain nicotine or tobacco. These products include cigarettes, chewing tobacco, and vaping devices, such as e-cigarettes. If you need help quitting, ask your provider. Avoid caffeine, alcohol, and certain over-the-counter cold medicines. These may make you feel worse. Ask your pharmacist which medicines to avoid. General instructions Take over-the-counter and prescription medicines only as told by your provider. Keep all follow-up visits. This is to make sure you are managing your anxiety well or if you need more support. Where to find support You can get help and support from: Self-help groups. Online and community organizations. A trusted spiritual leader. Couples counseling. Family education classes. Family therapy. Where to find more information You may find that joining a support group helps you deal with your anxiety. The following sources can help you find counselors or support groups near you: Mental Health America: mentalhealthamerica.net Anxiety and Depression Association of America (ADAA): adaa.org National Alliance on Mental Illness (NAMI): nami.org Contact  a health care provider if: You have a hard time staying focused or finishing tasks. You spend many hours a day feeling worried about everyday life. You are very tired because you cannot stop worrying. You start to have headaches or often feel tense. You have chronic nausea or diarrhea. Get help right away if: Your heart feels like it is racing. You have shortness of breath. You have thoughts of hurting yourself or others. Get help right away if you feel like you may hurt yourself or others, or have thoughts about taking your own life. Go to your nearest emergency room or: Call 911. Call the National Suicide Prevention Lifeline at 1-800-273-8255 or 988. This is open 24 hours a day. Text the Crisis Text Line at 741741. This information is not intended to replace advice given to you by your health care provider. Make sure you discuss any questions you have with your health care provider. Document Revised: 04/09/2022 Document Reviewed: 10/22/2020 Elsevier Patient Education  2024 Elsevier Inc.  

## 2023-03-12 NOTE — Assessment & Plan Note (Signed)
Chronic, ongoing.  Denies SI/HI.  Continue Celexa 20 MG at this time.  Continue this and adjust as needed.

## 2023-03-12 NOTE — Assessment & Plan Note (Addendum)
Chronic, stable.  Diagnosed by Dr. Judee Clara with psychiatry in 2022.  Tolerating medication well with good symptom control, continue current regimen.  She is agreeable to every 3 month visits (can be virtual and then in person once a year) + UDS annually.  Obain UDS next 11/10/22 (will obtain at physical and substance agreement on file.  Refills sent x 3 sent and predated.  Return in 3 months for further refills.

## 2023-03-12 NOTE — Progress Notes (Signed)
LMP 09/21/2020 (Approximate)    Subjective:    Patient ID: Lisa Baird, female    DOB: 1976/01/17, 47 y.o.   MRN: [id number]  HPI: Lisa Baird is a 47 y.o. female  Chief Complaint  Patient presents with   ADHD   Anxiety   Virtual Visit via Video Note  I connected with Lisa Baird on 03/12/23 at  2:00 PM EDT by a video enabled telemedicine application and verified that I am speaking with the correct person using two identifiers.  Location: Patient: home Provider: work   I discussed the limitations of evaluation and management by telemedicine and the availability of in person appointments. The patient expressed understanding and agreed to proceed.  I discussed the assessment and treatment plan with the patient. The patient was provided an opportunity to ask questions and all were answered. The patient agreed with the plan and demonstrated an understanding of the instructions.   The patient was advised to call back or seek an in-person evaluation if the symptoms worsen or if the condition fails to improve as anticipated.  I provided 21 minutes of non-face-to-face time during this encounter.   Marjie Skiff, NP   ADHD FOLLOW UP Continues on Adderall 20 MG BID, last fill 02/11/23.  She is taking Celexa 20 MG daily for anxiety. ADHD status: controlled Satisfied with current therapy: yes Medication compliance:  good compliance Controlled substance contract: yes Previous psychiatry evaluation: no Previous medications: none Taking meds on weekends/vacations: yes Work/school performance:  good Difficulty sustaining attention/completing tasks: no Distracted by extraneous stimuli: no Does not listen when spoken to: no  Fidgets with hands or feet: yes Unable to stay in seat: no Blurts out/interrupts others: no ADHD Medication Side Effects: no    Decreased appetite: no    Headache: no    Sleeping disturbance pattern: no    Irritability: no    Rebound effects (worse  than baseline) off medication: no    Anxiousness: no    Dizziness: no    Tics: no     03/12/2023    1:58 PM 12/10/2022    2:07 PM 09/10/2022    1:08 PM 06/10/2022    1:05 PM 11/09/2021   11:25 AM  Depression screen PHQ 2/9  Decreased Interest 0 0 0 1 0  Down, Depressed, Hopeless 0 0 0 1 1  PHQ - 2 Score 0 0 0 2 1  Altered sleeping 2 0 0 1 2  Tired, decreased energy 3 0 0 0 3  Change in appetite 0 0 0 0 1  Feeling bad or failure about yourself  0 0 0 0 0  Trouble concentrating 3 0 0 2 3  Moving slowly or fidgety/restless 0 0 0 1 3  Suicidal thoughts 0 0 0 0 0  PHQ-9 Score 8 0 0 6 13  Difficult doing work/chores Not difficult at all Not difficult at all  Somewhat difficult Somewhat difficult       03/12/2023    2:00 PM 12/10/2022    2:07 PM 09/10/2022    1:08 PM 06/10/2022    1:06 PM  GAD 7 : Generalized Anxiety Score  Nervous, Anxious, on Edge 0 0 0 1  Control/stop worrying 0 0 0 0  Worry too much - different things 0 0 0 0  Trouble relaxing 0 0 0 1  Restless 0 0 0 1  Easily annoyed or irritable 0 0 0 2  Afraid - awful might happen 0 0  0 0  Total GAD 7 Score 0 0 0 5  Anxiety Difficulty Not difficult at all Not difficult at all Not difficult at all Somewhat difficult    Relevant past medical, surgical, family and social history reviewed and updated as indicated. Interim medical history since our last visit reviewed. Allergies and medications reviewed and updated.  Review of Systems  Constitutional:  Negative for activity change, appetite change, diaphoresis, fatigue and fever.  Respiratory:  Negative for cough, chest tightness and shortness of breath.   Cardiovascular:  Negative for chest pain, palpitations and leg swelling.  Gastrointestinal: Negative.   Neurological: Negative.   Psychiatric/Behavioral:  Negative for decreased concentration, self-injury, sleep disturbance and suicidal ideas. The patient is not nervous/anxious.     Per HPI unless specifically indicated  above     Objective:    LMP 09/21/2020 (Approximate)   Wt Readings from Last 3 Encounters:  06/10/22 132 lb 3.2 oz (60 kg)  05/02/22 132 lb (59.9 kg)  03/19/22 128 lb 15.5 oz (58.5 kg)    Physical Exam Vitals and nursing note reviewed.  Constitutional:      General: She is awake. She is not in acute distress.    Appearance: She is well-developed. She is not ill-appearing.  HENT:     Head: Normocephalic.     Right Ear: Hearing normal.     Left Ear: Hearing normal.  Eyes:     General: Lids are normal.        Right eye: No discharge.        Left eye: No discharge.     Conjunctiva/sclera: Conjunctivae normal.  Pulmonary:     Effort: Pulmonary effort is normal. No accessory muscle usage or respiratory distress.  Musculoskeletal:     Cervical back: Normal range of motion.  Neurological:     Mental Status: She is alert and oriented to person, place, and time.  Psychiatric:        Attention and Perception: Attention normal.        Mood and Affect: Mood normal.        Behavior: Behavior normal. Behavior is cooperative.        Thought Content: Thought content normal.        Judgment: Judgment normal.    Results for orders placed or performed in visit on 06/10/22  CBC with Differential/Platelet  Result Value Ref Range   WBC 9.4 3.4 - 10.8 x10E3/uL   RBC 4.44 3.77 - 5.28 x10E6/uL   Hemoglobin 13.3 11.1 - 15.9 g/dL   Hematocrit 16.1 09.6 - 46.6 %   MCV 87 79 - 97 fL   MCH 30.0 26.6 - 33.0 pg   MCHC 34.5 31.5 - 35.7 g/dL   RDW 04.5 40.9 - 81.1 %   Platelets 247 150 - 450 x10E3/uL   Neutrophils 57 Not Estab. %   Lymphs 34 Not Estab. %   Monocytes 7 Not Estab. %   Eos 1 Not Estab. %   Basos 1 Not Estab. %   Neutrophils Absolute 5.4 1.4 - 7.0 x10E3/uL   Lymphocytes Absolute 3.2 (H) 0.7 - 3.1 x10E3/uL   Monocytes Absolute 0.6 0.1 - 0.9 x10E3/uL   EOS (ABSOLUTE) 0.1 0.0 - 0.4 x10E3/uL   Basophils Absolute 0.1 0.0 - 0.2 x10E3/uL   Immature Granulocytes 0 Not Estab. %    Immature Grans (Abs) 0.0 0.0 - 0.1 x10E3/uL  Comprehensive metabolic panel  Result Value Ref Range   Glucose 93 70 - 99 mg/dL  BUN 10 6 - 24 mg/dL   Creatinine, Ser 7.25 0.57 - 1.00 mg/dL   eGFR 366 >44 IH/KVQ/2.59   BUN/Creatinine Ratio 14 9 - 23   Sodium 140 134 - 144 mmol/L   Potassium 4.3 3.5 - 5.2 mmol/L   Chloride 102 96 - 106 mmol/L   CO2 23 20 - 29 mmol/L   Calcium 8.8 8.7 - 10.2 mg/dL   Total Protein 6.3 6.0 - 8.5 g/dL   Albumin 4.5 3.9 - 4.9 g/dL   Globulin, Total 1.8 1.5 - 4.5 g/dL   Albumin/Globulin Ratio 2.5 (H) 1.2 - 2.2   Bilirubin Total 0.3 0.0 - 1.2 mg/dL   Alkaline Phosphatase 60 44 - 121 IU/L   AST 16 0 - 40 IU/L   ALT 13 0 - 32 IU/L  Lipid Panel w/o Chol/HDL Ratio  Result Value Ref Range   Cholesterol, Total 198 100 - 199 mg/dL   Triglycerides 88 0 - 149 mg/dL   HDL 89 >56 mg/dL   VLDL Cholesterol Cal 16 5 - 40 mg/dL   LDL Chol Calc (NIH) 93 0 - 99 mg/dL  TSH  Result Value Ref Range   TSH 0.942 0.450 - 4.500 uIU/mL  VITAMIN D 25 Hydroxy (Vit-D Deficiency, Fractures)  Result Value Ref Range   Vit D, 25-Hydroxy 17.2 (L) 30.0 - 100.0 ng/mL  Hepatitis C antibody  Result Value Ref Range   Hep C Virus Ab Non Reactive Non Reactive  HIV Antibody (routine testing w rflx)  Result Value Ref Range   HIV Screen 4th Generation wRfx Non Reactive Non Reactive      Assessment & Plan:   Problem List Items Addressed This Visit       Other   ADHD - Primary    Chronic, stable.  Diagnosed by Dr. Judee Clara with psychiatry in 2022.  Tolerating medication well with good symptom control, continue current regimen.  She is agreeable to every 3 month visits (can be virtual and then in person once a year) + UDS annually.  Obain UDS next 11/10/22 (will obtain at physical and substance agreement on file.  Refills sent x 3 sent and predated.  Return in 3 months for further refills.      Anxiety    Chronic, ongoing.  Denies SI/HI.  Continue Celexa 20 MG at this time.   Continue this and adjust as needed.        I discussed the assessment and treatment plan with the patient. The patient was provided an opportunity to ask questions and all were answered. The patient agreed with the plan and demonstrated an understanding of the instructions.   The patient was advised to call back or seek an in-person evaluation if the symptoms worsen or if the condition fails to improve as anticipated.  Follow up plan: Return in about 3 months (around 06/12/2023) for Annual physical and UDS after 06/11/23.

## 2023-03-27 ENCOUNTER — Encounter: Payer: Self-pay | Admitting: Nurse Practitioner

## 2023-03-27 DIAGNOSIS — L2089 Other atopic dermatitis: Secondary | ICD-10-CM

## 2023-06-14 ENCOUNTER — Other Ambulatory Visit: Payer: Self-pay | Admitting: Nurse Practitioner

## 2023-06-14 NOTE — Patient Instructions (Incomplete)
 Managing Anxiety, Adult  After being diagnosed with anxiety, you may be relieved to know why you have felt or behaved a certain way. You may also feel overwhelmed about the treatment ahead and what it will mean for your life. With care and support, you can manage your anxiety.  How to manage lifestyle changes  Understanding the difference between stress and anxiety  Although stress can play a role in anxiety, it is not the same as anxiety. Stress is your body's reaction to life changes and events, both good and bad. Stress is often caused by something external, such as a deadline, test, or competition. It normally goes away after the event has ended and will last just a few hours. But, stress can be ongoing and can lead to more than just stress.  Anxiety is caused by something internal, such as imagining a terrible outcome or worrying that something will go wrong that will greatly upset you. Anxiety often does not go away even after the event is over, and it can become a long-term (chronic) worry.  Lowering stress and anxiety    Talk with your health care provider or a counselor to learn more about lowering anxiety and stress. They may suggest tension-reduction techniques, such as:  Music. Spend time creating or listening to music that you enjoy and that inspires you.  Mindfulness-based meditation. Practice being aware of your normal breaths while not trying to control your breathing. It can be done while sitting or walking.  Centering prayer. Focus on a word, phrase, or sacred image that means something to you and brings you peace.  Deep breathing. Expand your stomach and inhale slowly through your nose. Hold your breath for 3-5 seconds. Then breathe out slowly, letting your stomach muscles relax.  Self-talk. Learn to notice and spot thought patterns that lead to anxiety reactions. Change those patterns to thoughts that feel peaceful.  Muscle relaxation. Take time to tense muscles and then relax them.  Choose a  tension-reduction technique that fits your lifestyle and personality. These techniques take time and practice. Set aside 5-15 minutes a day to do them. Specialized therapists can offer counseling and training in these techniques. The training to help with anxiety may be covered by some insurance plans.  Other things you can do to manage stress and anxiety include:  Keeping a stress diary. This can help you learn what triggers your reaction and then learn ways to manage your response.  Thinking about how you react to certain situations. You may not be able to control everything, but you can control your response.  Making time for activities that help you relax and not feeling guilty about spending your time in this way.  Doing visual imagery. This involves imagining or creating mental pictures to help you relax.  Practicing yoga. Through yoga poses, you can lower tension and relax.     Medicines  Medicines for anxiety include:  Antidepressant medicines. These are usually prescribed for long-term daily control.  Anti-anxiety medicines. These may be added in severe cases, especially when panic attacks occur.  When used together, medicines, psychotherapy, and tension-reduction techniques may be the most effective treatment.  Relationships  Relationships can play a big part in helping you recover. Spend more time connecting with trusted friends and family members. Think about going to couples counseling if you have a partner, taking family education classes, or going to family therapy. Therapy can help you and others better understand your anxiety.  How to recognize changes in  your anxiety  Everyone responds differently to treatment for anxiety. Recovery from anxiety happens when symptoms lessen and stop interfering with your daily life at home or work. This may mean that you will start to:  Have better concentration and focus. Worry will interfere less in your daily thinking.  Sleep better.  Be less irritable.  Have  more energy.  Have improved memory.  Try to recognize when your condition is getting worse. Contact your provider if your symptoms interfere with home or work and you feel like your condition is not improving.  Follow these instructions at home:  Activity  Exercise. Adults should:  Exercise for at least 150 minutes each week. The exercise should increase your heart rate and make you sweat (moderate-intensity exercise).  Do strengthening exercises at least twice a week.  Get the right amount and quality of sleep. Most adults need 7-9 hours of sleep each night.  Lifestyle    Eat a healthy diet that includes plenty of vegetables, fruits, whole grains, low-fat dairy products, and lean protein.  Do not eat a lot of foods that are high in fats, added sugars, or salt (sodium).  Make choices that simplify your life.  Do not use any products that contain nicotine or tobacco. These products include cigarettes, chewing tobacco, and vaping devices, such as e-cigarettes. If you need help quitting, ask your provider.  Avoid caffeine, alcohol, and certain over-the-counter cold medicines. These may make you feel worse. Ask your pharmacist which medicines to avoid.  General instructions  Take over-the-counter and prescription medicines only as told by your provider.  Keep all follow-up visits. This is to make sure you are managing your anxiety well or if you need more support.  Where to find support  You can get help and support from:  Self-help groups.  Online and [employer].  A trusted spiritual leader.  Couples counseling.  Family education classes.  Family therapy.  Where to find more information  You may find that joining a support group helps you deal with your anxiety. The following sources can help you find counselors or support groups near you:  Mental Health America: mentalhealthamerica.net  Anxiety and Depression Association of [place] (ADAA): adaa.org  [employer] on Mental Illness (NAMI):  nami.org  Contact a health care provider if:  You have a hard time staying focused or finishing tasks.  You spend many hours a day feeling worried about everyday life.  You are very tired because you cannot stop worrying.  You start to have headaches or often feel tense.  You have chronic nausea or diarrhea.  Get help right away if:  Your heart feels like it is racing.  You have shortness of breath.  You have thoughts of hurting yourself or others.  Get help right away if you feel like you may hurt yourself or others, or have thoughts about taking your own life. Go to your nearest emergency room or:  Call 911.  Call the National Suicide Prevention Lifeline at 417-380-0019 or 988. This is open 24 hours a day.  Text the Crisis Text Line at 4151481703.  This information is not intended to replace advice given to you by your health care provider. Make sure you discuss any questions you have with your health care provider.  Document Revised: 04/09/2022 Document Reviewed: 10/22/2020  Elsevier Patient Education  2024 [employer].

## 2023-06-16 MED ORDER — AMPHETAMINE-DEXTROAMPHETAMINE 20 MG PO TABS: 20.0000 mg | ORAL_TABLET | Freq: Two times a day (BID) | ORAL | 0 refills | Status: DC

## 2023-06-17 ENCOUNTER — Encounter: Payer: Self-pay | Admitting: Nurse Practitioner

## 2023-06-17 ENCOUNTER — Encounter: Payer: 59 | Admitting: Nurse Practitioner

## 2023-06-17 DIAGNOSIS — Z1231 Encounter for screening mammogram for malignant neoplasm of breast: Secondary | ICD-10-CM

## 2023-06-17 DIAGNOSIS — F902 Attention-deficit hyperactivity disorder, combined type: Secondary | ICD-10-CM

## 2023-06-17 DIAGNOSIS — Z0000 Encounter for general adult medical examination without abnormal findings: Secondary | ICD-10-CM

## 2023-06-17 DIAGNOSIS — Z13220 Encounter for screening for lipoid disorders: Secondary | ICD-10-CM

## 2023-06-17 DIAGNOSIS — Z23 Encounter for immunization: Secondary | ICD-10-CM

## 2023-06-17 DIAGNOSIS — Z79899 Other long term (current) drug therapy: Secondary | ICD-10-CM

## 2023-06-17 DIAGNOSIS — F419 Anxiety disorder, unspecified: Secondary | ICD-10-CM

## 2023-06-17 DIAGNOSIS — E559 Vitamin D deficiency, unspecified: Secondary | ICD-10-CM

## 2023-07-16 ENCOUNTER — Other Ambulatory Visit: Payer: Self-pay | Admitting: Nurse Practitioner

## 2023-07-17 MED ORDER — AMPHETAMINE-DEXTROAMPHETAMINE 20 MG PO TABS: 20.0000 mg | ORAL_TABLET | Freq: Two times a day (BID) | ORAL | 0 refills | Status: DC

## 2023-07-26 DIAGNOSIS — Z79899 Other long term (current) drug therapy: Secondary | ICD-10-CM | POA: Insufficient documentation

## 2023-07-29 ENCOUNTER — Encounter: Payer: 59 | Admitting: Nurse Practitioner

## 2023-07-29 DIAGNOSIS — Z0000 Encounter for general adult medical examination without abnormal findings: Secondary | ICD-10-CM

## 2023-07-29 DIAGNOSIS — F902 Attention-deficit hyperactivity disorder, combined type: Secondary | ICD-10-CM

## 2023-07-29 DIAGNOSIS — Z79899 Other long term (current) drug therapy: Secondary | ICD-10-CM

## 2023-07-29 DIAGNOSIS — Z1231 Encounter for screening mammogram for malignant neoplasm of breast: Secondary | ICD-10-CM

## 2023-07-29 DIAGNOSIS — Z13220 Encounter for screening for lipoid disorders: Secondary | ICD-10-CM

## 2023-07-29 DIAGNOSIS — F419 Anxiety disorder, unspecified: Secondary | ICD-10-CM

## 2023-07-29 DIAGNOSIS — E559 Vitamin D deficiency, unspecified: Secondary | ICD-10-CM

## 2023-08-25 ENCOUNTER — Other Ambulatory Visit: Payer: Self-pay | Admitting: Nurse Practitioner

## 2023-08-25 MED ORDER — AMPHETAMINE-DEXTROAMPHETAMINE 20 MG PO TABS: 20.0000 mg | ORAL_TABLET | Freq: Two times a day (BID) | ORAL | 0 refills | Status: DC

## 2023-09-10 ENCOUNTER — Encounter: Payer: BLUE CROSS/BLUE SHIELD | Admitting: Nurse Practitioner

## 2023-09-10 DIAGNOSIS — Z0000 Encounter for general adult medical examination without abnormal findings: Secondary | ICD-10-CM

## 2023-09-10 DIAGNOSIS — F902 Attention-deficit hyperactivity disorder, combined type: Secondary | ICD-10-CM

## 2023-09-10 DIAGNOSIS — Z1231 Encounter for screening mammogram for malignant neoplasm of breast: Secondary | ICD-10-CM

## 2023-09-10 DIAGNOSIS — F419 Anxiety disorder, unspecified: Secondary | ICD-10-CM

## 2023-09-10 DIAGNOSIS — Z13220 Encounter for screening for lipoid disorders: Secondary | ICD-10-CM

## 2023-09-10 DIAGNOSIS — Z79899 Other long term (current) drug therapy: Secondary | ICD-10-CM

## 2023-09-10 DIAGNOSIS — E559 Vitamin D deficiency, unspecified: Secondary | ICD-10-CM

## 2023-09-24 ENCOUNTER — Other Ambulatory Visit: Payer: Self-pay | Admitting: Nurse Practitioner

## 2023-09-24 MED ORDER — AMPHETAMINE-DEXTROAMPHETAMINE 20 MG PO TABS: 20.0000 mg | ORAL_TABLET | Freq: Two times a day (BID) | ORAL | 0 refills | Status: AC

## 2023-09-29 ENCOUNTER — Encounter: Admitting: Nurse Practitioner

## 2023-09-29 DIAGNOSIS — Z0000 Encounter for general adult medical examination without abnormal findings: Secondary | ICD-10-CM

## 2023-09-29 DIAGNOSIS — F902 Attention-deficit hyperactivity disorder, combined type: Secondary | ICD-10-CM

## 2023-09-29 DIAGNOSIS — F419 Anxiety disorder, unspecified: Secondary | ICD-10-CM

## 2023-09-29 DIAGNOSIS — E559 Vitamin D deficiency, unspecified: Secondary | ICD-10-CM

## 2023-09-29 DIAGNOSIS — Z1231 Encounter for screening mammogram for malignant neoplasm of breast: Secondary | ICD-10-CM

## 2023-09-29 DIAGNOSIS — Z13220 Encounter for screening for lipoid disorders: Secondary | ICD-10-CM

## 2023-09-29 DIAGNOSIS — Z79899 Other long term (current) drug therapy: Secondary | ICD-10-CM

## 2023-10-17 ENCOUNTER — Other Ambulatory Visit: Payer: Self-pay | Admitting: Nurse Practitioner

## 2023-10-17 MED ORDER — AMPHETAMINE-DEXTROAMPHETAMINE 20 MG PO TABS
20.0000 mg | ORAL_TABLET | Freq: Two times a day (BID) | ORAL | 0 refills | Status: AC
Start: 2023-10-17 — End: ?

## 2023-10-17 NOTE — Telephone Encounter (Signed)
 Unable to refuse as I don't have the security on controlled meds.   Thanks!

## 2023-10-20 ENCOUNTER — Telehealth: Payer: Self-pay | Admitting: Nurse Practitioner

## 2023-10-20 NOTE — Telephone Encounter (Signed)
 Patients appt changed to 11-12-23. She is out of meds requesting refill be sent until appt date.  (ADDERALL) 20 MG tablet   [facility] - [place], [place]

## 2023-10-20 NOTE — Telephone Encounter (Signed)
 Reviewed patient's chart. RX on file to be filled 10/17/23. Contacted [facility] and confirmed that they had this on file and could fill tomorrow according to last fill date. RX set to be filled tomorrow for the patient.   Called and LVM letting the patient know the above information.

## 2023-10-21 ENCOUNTER — Ambulatory Visit: Admitting: Nurse Practitioner

## 2023-11-12 ENCOUNTER — Encounter: Admitting: Nurse Practitioner

## 2023-11-12 ENCOUNTER — Encounter: Payer: Self-pay | Admitting: Nurse Practitioner

## 2023-11-12 DIAGNOSIS — Z0000 Encounter for general adult medical examination without abnormal findings: Secondary | ICD-10-CM

## 2023-11-12 DIAGNOSIS — Z136 Encounter for screening for cardiovascular disorders: Secondary | ICD-10-CM

## 2023-11-12 DIAGNOSIS — Z79899 Other long term (current) drug therapy: Secondary | ICD-10-CM

## 2023-11-12 DIAGNOSIS — F902 Attention-deficit hyperactivity disorder, combined type: Secondary | ICD-10-CM

## 2023-11-12 DIAGNOSIS — F419 Anxiety disorder, unspecified: Secondary | ICD-10-CM

## 2023-11-12 DIAGNOSIS — E559 Vitamin D deficiency, unspecified: Secondary | ICD-10-CM

## 2023-11-12 DIAGNOSIS — Z1231 Encounter for screening mammogram for malignant neoplasm of breast: Secondary | ICD-10-CM

## 2023-11-12 DIAGNOSIS — Z23 Encounter for immunization: Secondary | ICD-10-CM

## 2023-12-07 NOTE — Patient Instructions (Incomplete)
 Be Involved in Caring For Your Health:  Taking Medications When medications are taken as directed, they can greatly improve your health. But if they are not taken as prescribed, they may not work. In some cases, not taking them correctly can be harmful. To help ensure your treatment remains effective and safe, understand your medications and how to take them. Bring your medications to each visit for review by your provider.  Your lab results, notes, and after visit summary will be available on My Chart. We strongly encourage you to use this feature. If lab results are abnormal the clinic will contact you with the appropriate steps. If the clinic does not contact you assume the results are satisfactory. You can always view your results on My Chart. If you have questions regarding your health or results, please contact the clinic during office hours. You can also ask questions on My Chart.  We at [facility] are grateful that you chose [place] to provide your care. We strive to provide evidence-based and compassionate care and are always looking for feedback. If you get a survey from the clinic please complete this so we can hear your opinions.  Healthy Eating, Adult Healthy eating may help you get and keep a healthy body weight, reduce the risk of chronic disease, and live a long and productive life. It is important to follow a healthy eating pattern. Your nutritional and calorie needs should be met mainly by different nutrient-rich foods. What are tips for following this plan? Reading food labels Read labels and choose the following: Reduced or low sodium products. Juices with 100% fruit juice. Foods with low saturated fats (<3 g per serving) and high polyunsaturated and monounsaturated fats. Foods with whole grains, such as whole wheat, cracked wheat, brown rice, and wild rice. Whole grains that are fortified with folic acid. This is recommended for females who are pregnant or who want  to become pregnant. Read labels and do not eat or drink the following: Foods or drinks with added sugars. These include foods that contain brown sugar, corn sweetener, corn syrup, dextrose, fructose, glucose, high-fructose corn syrup, honey, invert sugar, lactose, malt syrup, maltose, molasses, raw sugar, sucrose, trehalose, or turbinado sugar. Limit your intake of added sugars to less than 10% of your total daily calories. Do not eat more than the following amounts of added sugar per day: 6 teaspoons (25 g) for females. 9 teaspoons (38 g) for males. Foods that contain processed or refined starches and grains. Refined grain products, such as white flour, degermed cornmeal, white bread, and white rice. Shopping Choose nutrient-rich snacks, such as vegetables, whole fruits, and nuts. Avoid high-calorie and high-sugar snacks, such as potato chips, fruit snacks, and candy. Use oil-based dressings and spreads on foods instead of solid fats such as butter, margarine, sour cream, or cream cheese. Limit pre-made sauces, mixes, and "instant" products such as flavored rice, instant noodles, and ready-made pasta. Try more plant-protein sources, such as tofu, tempeh, black beans, edamame, lentils, nuts, and seeds. Explore eating plans such as the Mediterranean diet or vegetarian diet. Try heart-healthy dips made with beans and healthy fats like hummus and guacamole. Vegetables go great with these. Cooking Use oil to saut or stir-fry foods instead of solid fats such as butter, margarine, or lard. Try baking, boiling, grilling, or broiling instead of frying. Remove the fatty part of meats before cooking. Steam vegetables in water or broth. Meal planning  At meals, imagine dividing your plate into fourths: One-half of  your plate is fruits and vegetables. One-fourth of your plate is whole grains. One-fourth of your plate is protein, especially lean meats, poultry, eggs, tofu, beans, or nuts. Include  low-fat dairy as part of your daily diet. Lifestyle Choose healthy options in all settings, including home, work, school, restaurants, or stores. Prepare your food safely: Wash your hands after handling raw meats. Where you prepare food, keep surfaces clean by regularly washing with hot, soapy water. Keep raw meats separate from ready-to-eat foods, such as fruits and vegetables. Cook seafood, meat, poultry, and eggs to the recommended temperature. Get a food thermometer. Store foods at safe temperatures. In general: Keep cold foods at 76F (4.4C) or below. Keep hot foods at 176F (60C) or above. Keep your freezer at [facility] (-17.8C) or below. Foods are not safe to eat if they have been between the temperatures of 40-176F (4.4-60C) for more than 2 hours. What foods should I eat? Fruits Aim to eat 1-2 cups of fresh, canned (in natural juice), or frozen fruits each day. One cup of fruit equals 1 small apple, 1 large banana, 8 large strawberries, 1 cup (237 g) canned fruit,  cup (82 g) dried fruit, or 1 cup (240 mL) 100% juice. Vegetables Aim to eat 2-4 cups of fresh and frozen vegetables each day, including different varieties and colors. One cup of vegetables equals 1 cup (91 g) broccoli or cauliflower florets, 2 medium carrots, 2 cups (150 g) raw, leafy greens, 1 large tomato, 1 large bell pepper, 1 large sweet potato, or 1 medium white potato. Grains Aim to eat 5-10 ounce-equivalents of whole grains each day. Examples of 1 ounce-equivalent of grains include 1 slice of bread, 1 cup (40 g) ready-to-eat cereal, 3 cups (24 g) popcorn, or  cup (93 g) cooked rice. Meats and other proteins Try to eat 5-7 ounce-equivalents of protein each day. Examples of 1 ounce-equivalent of protein include 1 egg,  oz nuts (12 almonds, 24 pistachios, or 7 walnut halves), 1/4 cup (90 g) cooked beans, 6 tablespoons (90 g) hummus or 1 tablespoon (16 g) peanut butter. A cut of meat or fish that is the size of a deck  of cards is about 3-4 ounce-equivalents (85 g). Of the protein you eat each week, try to have at least 8 sounce (227 g) of seafood. This is about 2 servings per week. This includes salmon, trout, herring, sardines, and anchovies. Dairy Aim to eat 3 cup-equivalents of fat-free or low-fat dairy each day. Examples of 1 cup-equivalent of dairy include 1 cup (240 mL) milk, 8 ounces (250 g) yogurt, 1 ounces (44 g) natural cheese, or 1 cup (240 mL) fortified soy milk. Fats and oils Aim for about 5 teaspoons (21 g) of fats and oils per day. Choose monounsaturated fats, such as canola and olive oils, mayonnaise made with olive oil or avocado oil, avocados, peanut butter, and most nuts, or polyunsaturated fats, such as sunflower, corn, and soybean oils, walnuts, pine nuts, sesame seeds, sunflower seeds, and flaxseed. Beverages Aim for 6 eight-ounce glasses of water per day. Limit coffee to 3-5 eight-ounce cups per day. Limit caffeinated beverages that have added calories, such as soda and energy drinks. If you drink alcohol: Limit how much you have to: 0-1 drink a day if you are female. 0-2 drinks a day if you are female. Know how much alcohol is in your drink. In the [place] one drink is one 12 oz bottle of beer (355 mL), one 5 oz glass of wine (  148 mL), or one 1 oz glass of hard liquor (44 mL). Seasoning and other foods Try not to add too much salt to your food. Try using herbs and spices instead of salt. Try not to add sugar to food. This information is based on [place] nutrition guidelines. To learn more, visit [employer]. Exact amounts may vary. You may need different amounts. This information is not intended to replace advice given to you by your health care provider. Make sure you discuss any questions you have with your health care provider. Document Revised: 04/01/2022 Document Reviewed: 04/01/2022 Elsevier Patient Education  2024 [employer].

## 2023-12-09 ENCOUNTER — Telehealth: Payer: Self-pay | Admitting: Nurse Practitioner

## 2023-12-09 NOTE — Telephone Encounter (Signed)
 Prescription Request  12/09/2023  LOV: Visit date not found  What is the name of the medication or equipment? amphetamine -dextroamphetamine  (ADDERALL) 20 MG tablet   Have you contacted your pharmacy to request a refill? No   Which pharmacy would you like this sent to?     [facility] [address] [place], [place] - [address] [place] [address] Phone: [phone] Fax: [phone]   Patient notified that their request is being sent to the clinical staff for review and that they should receive a response within 2 business days.   Please advise at Mobile [phone] (mobile)

## 2023-12-10 ENCOUNTER — Ambulatory Visit: Admitting: Nurse Practitioner

## 2023-12-10 DIAGNOSIS — F419 Anxiety disorder, unspecified: Secondary | ICD-10-CM

## 2023-12-10 DIAGNOSIS — F902 Attention-deficit hyperactivity disorder, combined type: Secondary | ICD-10-CM

## 2023-12-10 DIAGNOSIS — Z1231 Encounter for screening mammogram for malignant neoplasm of breast: Secondary | ICD-10-CM

## 2023-12-10 DIAGNOSIS — E559 Vitamin D deficiency, unspecified: Secondary | ICD-10-CM

## 2023-12-10 DIAGNOSIS — Z13220 Encounter for screening for lipoid disorders: Secondary | ICD-10-CM

## 2023-12-10 DIAGNOSIS — Z79899 Other long term (current) drug therapy: Secondary | ICD-10-CM

## 2023-12-10 DIAGNOSIS — Z0000 Encounter for general adult medical examination without abnormal findings: Secondary | ICD-10-CM

## 2023-12-10 NOTE — Telephone Encounter (Signed)
 See mychart messages

## 2023-12-21 NOTE — Patient Instructions (Incomplete)

## 2023-12-24 ENCOUNTER — Encounter: Admitting: Nurse Practitioner

## 2023-12-24 ENCOUNTER — Encounter: Payer: Self-pay | Admitting: Nurse Practitioner

## 2023-12-24 DIAGNOSIS — E559 Vitamin D deficiency, unspecified: Secondary | ICD-10-CM

## 2023-12-24 DIAGNOSIS — Z0000 Encounter for general adult medical examination without abnormal findings: Secondary | ICD-10-CM

## 2023-12-24 DIAGNOSIS — F902 Attention-deficit hyperactivity disorder, combined type: Secondary | ICD-10-CM

## 2023-12-24 DIAGNOSIS — F419 Anxiety disorder, unspecified: Secondary | ICD-10-CM

## 2023-12-24 DIAGNOSIS — Z79899 Other long term (current) drug therapy: Secondary | ICD-10-CM

## 2023-12-24 DIAGNOSIS — Z13220 Encounter for screening for lipoid disorders: Secondary | ICD-10-CM
